# Patient Record
Sex: Male | Born: 1975 | Hispanic: Yes | Marital: Single | Smoking: Current every day smoker
Health system: Southern US, Community
[De-identification: ages and names within clinical notes are randomized; demographics above are authoritative.]

## PROBLEM LIST (undated history)

## (undated) DIAGNOSIS — I10 Essential (primary) hypertension: Secondary | ICD-10-CM

## (undated) HISTORY — PX: OTHER SURGICAL HISTORY: SHX169

---

## 2013-11-15 ENCOUNTER — Emergency Department (HOSPITAL_COMMUNITY): Payer: Self-pay

## 2013-11-15 ENCOUNTER — Encounter (HOSPITAL_COMMUNITY): Payer: Self-pay | Admitting: Emergency Medicine

## 2013-11-15 ENCOUNTER — Emergency Department (HOSPITAL_COMMUNITY)
Admission: EM | Admit: 2013-11-15 | Discharge: 2013-11-15 | Disposition: A | Discharge status: Home / Self Care | Payer: Self-pay | Attending: Emergency Medicine | Admitting: Emergency Medicine

## 2013-11-15 DIAGNOSIS — Y9389 Activity, other specified: Secondary | ICD-10-CM | POA: Insufficient documentation

## 2013-11-15 DIAGNOSIS — IMO0002 Reserved for concepts with insufficient information to code with codable children: Secondary | ICD-10-CM | POA: Insufficient documentation

## 2013-11-15 DIAGNOSIS — F17200 Nicotine dependence, unspecified, uncomplicated: Secondary | ICD-10-CM | POA: Insufficient documentation

## 2013-11-15 DIAGNOSIS — T148XXA Other injury of unspecified body region, initial encounter: Secondary | ICD-10-CM

## 2013-11-15 DIAGNOSIS — M549 Dorsalgia, unspecified: Secondary | ICD-10-CM

## 2013-11-15 DIAGNOSIS — Y929 Unspecified place or not applicable: Secondary | ICD-10-CM | POA: Insufficient documentation

## 2013-11-15 DIAGNOSIS — X500XXA Overexertion from strenuous movement or load, initial encounter: Secondary | ICD-10-CM | POA: Insufficient documentation

## 2013-11-15 LAB — URINALYSIS, ROUTINE W REFLEX MICROSCOPIC
Bilirubin Urine: NEGATIVE
Glucose, UA: NEGATIVE mg/dL
HGB URINE DIPSTICK: NEGATIVE
Ketones, ur: NEGATIVE mg/dL
Leukocytes, UA: NEGATIVE
NITRITE: NEGATIVE
PROTEIN: NEGATIVE mg/dL
Specific Gravity, Urine: 1.025 (ref 1.005–1.030)
UROBILINOGEN UA: 0.2 mg/dL (ref 0.0–1.0)
pH: 5.5 (ref 5.0–8.0)

## 2013-11-15 MED ORDER — OXYCODONE-ACETAMINOPHEN 5-325 MG PO TABS
1.0000 | ORAL_TABLET | ORAL | Status: DC | PRN
Start: 2013-11-15 — End: 2014-01-05

## 2013-11-15 MED ORDER — IBUPROFEN 800 MG PO TABS
800.0000 mg | ORAL_TABLET | Freq: Once | ORAL | Status: AC
  Administered 2013-11-15: 800 mg via ORAL
  Filled 2013-11-15: qty 1

## 2013-11-15 MED ORDER — PREDNISONE 10 MG PO TABS: ORAL_TABLET | ORAL | Status: DC

## 2013-11-15 MED ORDER — CYCLOBENZAPRINE HCL 10 MG PO TABS: 10.0000 mg | ORAL_TABLET | Freq: Three times a day (TID) | ORAL | Status: DC | PRN

## 2013-11-15 MED ORDER — OXYCODONE-ACETAMINOPHEN 5-325 MG PO TABS
1.0000 | ORAL_TABLET | Freq: Once | ORAL | Status: AC
  Administered 2013-11-15: 1 via ORAL
  Filled 2013-11-15: qty 1

## 2013-11-15 NOTE — Discharge Instructions (Signed)
Dolor de espalda, adultos  (Back Pain, Adult)  El dolor de espalda es frecuente. Con frecuencia mejora luego de algn tiempo. La causa suele no ser un peligro para la vida. La mayora de las personas aprende a controlarlo por sus propios medios.  CUIDADOS EN EL HOGAR   Mantngase fsicamente activo. Si puede, comience a dar cortas caminatas en un suelo plano. Trate de caminar un poco ms cada da.  Nopermanezca sentado, de pie ni conduzca automviles durante ms de 30 minutos seguidos. Nose quede en la cama.  Noevite los ejercicios ni el trabajo. La actividad puede ayudar a que la espalda se cure ms rpido.  Tenga cuidado al inclinarse o al levantar un objeto. Doble las rodillas, mantenga el [place] cerca de su cuerpo y no gire.  Duerma sobre un [employer] Acustese sobre un lado y doble las rodillas. Si se [employer], coloque una almohada debajo de las rodillas.  Tome la medicacin slo como le haya indicado el mdico.  Aplique hielo sobre la zona lesionada.  Ponga el hielo en una bolsa plstica.  Colquese una toalla entre la piel y la bolsa de hielo.  Deje la bolsa de hielo durante 15 a 3 a 4 veces por da, durante los primeros 2 o 3 das. Luego puede ir alternando entre hielo y compresas calientes.  Consulte a su mdico sobre cul ejercicios o IT sales professional.  Evite sentirse ansioso o estresado. Encuentre la forma de enfrentar el estrs, como por Lexicographer ejercicios. SOLICITE AYUDA DE INMEDIATO SI:   El dolor no desaparece aunque haga reposo o tome medicamentos para Chief Technology Officer.  El dolor no desaparece en una semana.  Tiene nuevos problemas.  No se siente mejor.  El dolor se extiende a las piernas.  No puede controlar la orina o la materia fecal.  Siente que los brazos estn dbiles o pierde la sensibilidad (estn adormecidos).  Tiene Programme researcher, broadcasting/film/video (nuseas) y vmitos.  Siente dolor abdominal.  Siente que se desvanece (se  desmaya). ASEGRESE DE QUE:   Comprende estas instrucciones.  Controlar su enfermedad.  Solicitar ayuda de inmediato si no mejora o si empeora. Document Released: 03/31/2011 Document Revised: 12/08/2011 [employer] Patient Information 2014 [place], [place].  Esguince de ligamento (Ligament Sprain) Un esguince de ligamento se produce cuando se estiran las bandas de tejido que mantienen unidos a los huesos (ligamento). CUIDADOS EN EL HOGAR   Mantenga la zona afectada en reposo.  Comience a usar la articulacin cuando su mdico se lo indique.  Mantenga el rea lesionada levantada (elevada) por encima del nivel del corazn. Esto podra aliviar la hinchazn (inflamacin).  Aplique hielo sobre la zona lesionada.  Ponga el hielo en una bolsa plstica.  Colquese una toalla entre la piel y la bolsa de hielo.  Deje el hielo durante 15 a , 3 a 4veces por da.  Use una frula, un yeso o una venda elstica segn las indicaciones de su mdico.  Solo tome los medicamentos que le haya indicado su mdico.  Utilice las [employer] como le haya indicado el mdico. No cargue peso sobre la articulacin hasta que el mdico lo autorice. SOLICITE AYUDA DE INMEDIATO SI:   Tiene ms moretones, hinchazn o dolor.  La lesin es en la pierna y tiene los dedos de los pies fros, [place], con hormigueo o adormecidos.  La lesin es en el brazo y tiene los dedos de las manos fros, [place], con hormigueo o adormecidos.  Siente un dolor intenso que  no se Pepco Holdingsalivia los medicamentos.  El dolor [place]pe[place]. ASEGRESE DE QUE:   Comprende estas instrucciones.  Controlar la enfermedad.  Recibir ayuda de inmediato si no mejora o si empeora. Document Released: 07/06/2013 [facility] Patient Information 2014 [employer].

## 2013-11-15 NOTE — ED Provider Notes (Signed)
CSN: [id number]     Arrival date & time 11/15/13  1806 History   First MD Initiated Contact with Patient 11/15/13 1823     Chief Complaint  Patient presents with  . Back Pain     (Consider location/radiation/quality/duration/timing/severity/associated sxs/prior Treatment) Patient is a 38 y.o. male presenting with back pain. The history is provided by the patient.  Back Pain Location:  Lumbar spine Quality:  Aching Radiates to:  L posterior upper leg and R posterior upper leg Pain severity:  Moderate Pain is:  Same all the time Onset quality:  Gradual Duration:  4 weeks Timing:  Constant Progression:  Unchanged Chronicity:  New Context: lifting heavy objects and twisting   Context comment:  Low back pain for 4 weeks that began after cutting wood.  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   Relieved by:  Being still Worsened by:  Ambulation, bending and sitting Ineffective treatments:  Ibuprofen Associated symptoms: no abdominal pain, no abdominal swelling, no bladder incontinence, no bowel incontinence, no chest pain, no dysuria, no fever, no headaches, no leg pain, no numbness, no paresthesias, no pelvic pain, no perianal numbness, no tingling and no weakness    Patient states that he was seen at another facility for this at the time of onset, but states that the symptoms have not improved.  He also states that his back pain is worse after working.  He also describes a cold, tingling sensation to his bilateral feet that "comes and goes".  He denies abdominal pain, fever, chills, dysuria, incontinence of bladder or bowel, or pain to his testicles   History reviewed. No pertinent past medical history. History reviewed. No pertinent past surgical history. Family History  Problem Relation Age of Onset  . Diabetes Other    History  Substance Use Topics  . Smoking status: Current Every Day Smoker -- 0.03 packs/day for 15 years    Types: Cigarettes  . Smokeless  tobacco: Never Used  . Alcohol Use: No     Comment: occasional    Review of Systems  Constitutional: Negative for fever.  Respiratory: Negative for shortness of breath.   Cardiovascular: Negative for chest pain.  Gastrointestinal: Negative for vomiting, abdominal pain, constipation and bowel incontinence.  Genitourinary: Negative for bladder incontinence, dysuria, hematuria, flank pain, decreased urine volume, difficulty urinating and pelvic pain.       No perineal numbness or incontinence of urine or feces  Musculoskeletal: Positive for back pain. Negative for joint swelling, neck pain and neck stiffness.  Skin: Negative for rash.  Neurological: Negative for tingling, weakness, numbness, headaches and paresthesias.  All other systems reviewed and are negative.      Allergies  Review of patient's allergies indicates no known allergies.  Home Medications   Current Outpatient Rx  Name  Route  Sig  Dispense  Refill  . ibuprofen (ADVIL,MOTRIN) 200 MG tablet   Oral   Take 600 mg by mouth every 6 (six) hours as needed for moderate pain.         . cyclobenzaprine (FLEXERIL) 10 MG tablet   Oral   Take 1 tablet (10 mg total) by mouth 3 (three) times daily as needed.   21 tablet   0   . oxyCODONE-acetaminophen (PERCOCET/ROXICET) 5-325 MG per tablet   Oral   Take 1 tablet by mouth every 4 (four) hours as needed for severe pain.   20 tablet   0   . predniSONE (DELTASONE) 10 MG tablet  Take 6 tablets day one, 5 tablets day two, 4 tablets day three, 3 tablets day four, 2 tablets day five, then 1 tablet day six   21 tablet   0    BP 143/95  Pulse 96  Temp(Src) 98.1 F (36.7 C) (Oral)  Resp 20  SpO2 100%   Physical Exam  Nursing note and vitals reviewed. Constitutional: He is oriented to person, place, and time. He appears well-developed and well-nourished. No distress.  HENT:  Head: Normocephalic and atraumatic.  Neck: Normal range of motion. Neck supple.    Cardiovascular: Normal rate, regular rhythm, normal heart sounds and intact distal pulses.   No murmur heard. Pulmonary/Chest: Effort normal and breath sounds normal. No respiratory distress. He exhibits no tenderness.  Abdominal: Soft. He exhibits no distension. There is no tenderness. There is no rebound and no guarding.  Musculoskeletal: Normal range of motion. He exhibits tenderness. He exhibits no edema.       Lumbar back: He exhibits tenderness and pain. He exhibits normal range of motion, no swelling, no deformity, no laceration and normal pulse.       Back:  ttp of the lumbar spine and paraspinal muscles.     DP pulses are brisk and symmetrical.  Distal sensation intact.  Hip Flexors/Extensors are intact  Neurological: He is alert and oriented to person, place, and time. He has normal strength. No sensory deficit. He exhibits normal muscle tone. Coordination and gait normal.  Reflex Scores:      Patellar reflexes are 2+ on the right side and 2+ on the left side.      Achilles reflexes are 2+ on the right side and 2+ on the left side. Skin: Skin is warm and dry. No rash noted.  Skin of the bilateral LE's is warm and pink.  CR < 2 sec    ED Course  Procedures (including critical care time) Labs Review Labs Reviewed  URINALYSIS, ROUTINE W REFLEX MICROSCOPIC   Imaging Review Dg Lumbar Spine Complete  11/15/2013   CLINICAL DATA:  Low back pain after injury.  EXAM: LUMBAR SPINE - COMPLETE 4+ VIEW  COMPARISON:  None.  FINDINGS: No acute fracture or spondylolisthesis is noted. 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 Disc spaces are well-maintained.  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 No acute abnormality seen in the lumbar spine.   Electronically Signed   By: Roque LiasJames  Green M.D.   On: 11/15/2013 19:34    EKG Interpretation   None       MDM   Final diagnoses:  Back pain  Muscle strain    Patient is well appearing, non-toxic.  pain lower back is reproduced with  palpation and SLR bilaterally.  No focal neuro deficits.  X ray and U/A results reviewed and discussed with patient.  No concerning symptoms for emergent neurological or infectious process. Pt agrees to care plan and verbalized understanding.    The patient appears reasonably screened and/or stabilized for discharge and I doubt any other medical condition or other [facility] requiring further screening, evaluation, or treatment in the ED at this time prior to discharge.     Alicea Wente L. Trisha Mangleriplett, PA-C 11/15/13 2355

## 2013-11-15 NOTE — ED Notes (Signed)
Per patient splitting wood 3-4 weeks ago. Per patient woke the next day and stated that he could hardly move. Patient c/o lower back pain that radiates into legs bilaterally. Per patient feet are becoming cold.  [facility] and given ibuprofen 800mg , cyclobenzaprine 10mg , and vicodin 5/325mg . Patient states "It would help the pain but come right back."

## 2013-11-15 NOTE — ED Notes (Signed)
Low back pain,, onset after splitting wood 4 weeks ago  .  [facility]. And given medication. But the pain has returned.  Discomfort into bil legs. And says his leg feel cold.

## 2013-11-17 NOTE — ED Provider Notes (Signed)
Medical screening examination/treatment/procedure(s) were performed by non-physician practitioner and as supervising physician I was immediately available for consultation/collaboration.  EKG Interpretation   None         Kasra Melvin W Bryar Rennie, MD 11/17/13 0100 

## 2014-01-05 ENCOUNTER — Emergency Department (HOSPITAL_COMMUNITY)
Admission: EM | Admit: 2014-01-05 | Discharge: 2014-01-05 | Disposition: A | Discharge status: Home / Self Care | Payer: Self-pay | Attending: Emergency Medicine | Admitting: Emergency Medicine

## 2014-01-05 ENCOUNTER — Encounter (HOSPITAL_COMMUNITY): Payer: Self-pay | Admitting: Emergency Medicine

## 2014-01-05 ENCOUNTER — Emergency Department (HOSPITAL_COMMUNITY): Payer: Self-pay

## 2014-01-05 DIAGNOSIS — M25569 Pain in unspecified knee: Secondary | ICD-10-CM | POA: Insufficient documentation

## 2014-01-05 DIAGNOSIS — R1011 Right upper quadrant pain: Secondary | ICD-10-CM | POA: Insufficient documentation

## 2014-01-05 DIAGNOSIS — Z791 Long term (current) use of non-steroidal anti-inflammatories (NSAID): Secondary | ICD-10-CM | POA: Insufficient documentation

## 2014-01-05 DIAGNOSIS — Z79899 Other long term (current) drug therapy: Secondary | ICD-10-CM | POA: Insufficient documentation

## 2014-01-05 DIAGNOSIS — R1012 Left upper quadrant pain: Secondary | ICD-10-CM | POA: Insufficient documentation

## 2014-01-05 DIAGNOSIS — M2550 Pain in unspecified joint: Secondary | ICD-10-CM

## 2014-01-05 DIAGNOSIS — R109 Unspecified abdominal pain: Secondary | ICD-10-CM

## 2014-01-05 DIAGNOSIS — R110 Nausea: Secondary | ICD-10-CM | POA: Insufficient documentation

## 2014-01-05 DIAGNOSIS — M549 Dorsalgia, unspecified: Secondary | ICD-10-CM | POA: Insufficient documentation

## 2014-01-05 DIAGNOSIS — M542 Cervicalgia: Secondary | ICD-10-CM | POA: Insufficient documentation

## 2014-01-05 DIAGNOSIS — Z87891 Personal history of nicotine dependence: Secondary | ICD-10-CM | POA: Insufficient documentation

## 2014-01-05 DIAGNOSIS — R079 Chest pain, unspecified: Secondary | ICD-10-CM | POA: Insufficient documentation

## 2014-01-05 DIAGNOSIS — R509 Fever, unspecified: Secondary | ICD-10-CM | POA: Insufficient documentation

## 2014-01-05 LAB — CBC WITH DIFFERENTIAL/PLATELET
BASOS ABS: 0 10*3/uL (ref 0.0–0.1)
Basophils Relative: 0 % (ref 0–1)
EOS PCT: 3 % (ref 0–5)
Eosinophils Absolute: 0.3 10*3/uL (ref 0.0–0.7)
HCT: 40.8 % (ref 39.0–52.0)
Hemoglobin: 13.3 g/dL (ref 13.0–17.0)
LYMPHS ABS: 2.6 10*3/uL (ref 0.7–4.0)
LYMPHS PCT: 23 % (ref 12–46)
MCH: 28.4 pg (ref 26.0–34.0)
MCHC: 32.6 g/dL (ref 30.0–36.0)
MCV: 87 fL (ref 78.0–100.0)
Monocytes Absolute: 0.8 10*3/uL (ref 0.1–1.0)
Monocytes Relative: 7 % (ref 3–12)
NEUTROS PCT: 67 % (ref 43–77)
Neutro Abs: 7.4 10*3/uL (ref 1.7–7.7)
PLATELETS: 398 10*3/uL (ref 150–400)
RBC: 4.69 MIL/uL (ref 4.22–5.81)
RDW: 13.6 % (ref 11.5–15.5)
WBC: 11 10*3/uL — AB (ref 4.0–10.5)

## 2014-01-05 LAB — URINALYSIS, ROUTINE W REFLEX MICROSCOPIC
BILIRUBIN URINE: NEGATIVE
GLUCOSE, UA: NEGATIVE mg/dL
Ketones, ur: NEGATIVE mg/dL
Leukocytes, UA: NEGATIVE
Nitrite: NEGATIVE
PH: 6 (ref 5.0–8.0)
Protein, ur: NEGATIVE mg/dL
Specific Gravity, Urine: 1.01 (ref 1.005–1.030)
Urobilinogen, UA: 0.2 mg/dL (ref 0.0–1.0)

## 2014-01-05 LAB — COMPREHENSIVE METABOLIC PANEL
ALK PHOS: 265 U/L — AB (ref 39–117)
ALT: 72 U/L — ABNORMAL HIGH (ref 0–53)
AST: 35 U/L (ref 0–37)
Albumin: 3.7 g/dL (ref 3.5–5.2)
BUN: 13 mg/dL (ref 6–23)
CALCIUM: 10.3 mg/dL (ref 8.4–10.5)
CO2: 29 meq/L (ref 19–32)
Chloride: 96 mEq/L (ref 96–112)
Creatinine, Ser: 0.72 mg/dL (ref 0.50–1.35)
GFR calc Af Amer: >90 mL/min (ref >90)
GFR calc non Af Amer: >90 mL/min (ref >90)
Glucose, Bld: 99 mg/dL (ref 70–99)
POTASSIUM: 4.1 meq/L (ref 3.7–5.3)
SODIUM: 139 meq/L (ref 137–147)
TOTAL PROTEIN: 9.3 g/dL — AB (ref 6.0–8.3)
Total Bilirubin: 0.7 mg/dL (ref 0.3–1.2)

## 2014-01-05 LAB — URINE MICROSCOPIC-ADD ON

## 2014-01-05 LAB — LIPASE, BLOOD: Lipase: 15 U/L (ref 11–59)

## 2014-01-05 MED ORDER — HYDROCODONE-ACETAMINOPHEN 5-325 MG PO TABS: 1.0000 | ORAL_TABLET | ORAL | Status: DC | PRN

## 2014-01-05 MED ORDER — ONDANSETRON HCL 4 MG/2ML IJ SOLN
4.0000 mg | Freq: Once | INTRAMUSCULAR | Status: AC
  Administered 2014-01-05: 4 mg via INTRAMUSCULAR
  Filled 2014-01-05: qty 2

## 2014-01-05 MED ORDER — SODIUM CHLORIDE 0.9 % IV BOLUS (SEPSIS)
1000.0000 mL | Freq: Once | INTRAVENOUS | Status: AC
  Administered 2014-01-05: 1000 mL via INTRAVENOUS

## 2014-01-05 MED ORDER — IOHEXOL 300 MG/ML  SOLN
100.0000 mL | Freq: Once | INTRAMUSCULAR | Status: AC | PRN
  Administered 2014-01-05: 100 mL via INTRAVENOUS

## 2014-01-05 MED ORDER — MORPHINE SULFATE 4 MG/ML IJ SOLN
4.0000 mg | Freq: Once | INTRAMUSCULAR | Status: AC
  Administered 2014-01-05: 4 mg via INTRAVENOUS
  Filled 2014-01-05: qty 1

## 2014-01-05 MED ORDER — CYCLOBENZAPRINE HCL 5 MG PO TABS: 5.0000 mg | ORAL_TABLET | Freq: Three times a day (TID) | ORAL | Status: DC | PRN

## 2014-01-05 MED ORDER — IBUPROFEN 600 MG PO TABS: 600.0000 mg | ORAL_TABLET | Freq: Four times a day (QID) | ORAL | Status: DC | PRN

## 2014-01-05 MED ORDER — IOHEXOL 300 MG/ML  SOLN
50.0000 mL | Freq: Once | INTRAMUSCULAR | Status: AC | PRN
  Administered 2014-01-05: 50 mL via ORAL

## 2014-01-05 NOTE — ED Notes (Signed)
Pt reports for the past 2 1/2 months has had pain in head and back and chest has been tender to touch.  Denies injury.

## 2014-01-05 NOTE — ED Provider Notes (Signed)
Medical screening examination/treatment/procedure(s) were performed by non-physician practitioner and as supervising physician I was immediately available for consultation/collaboration.   EKG Interpretation   Date/Time:  Thursday January 05 2014 10:46:26 EDT Ventricular Rate:  73 PR Interval:  166 QRS Duration: 90 QT Interval:  352 QTC Calculation: 387 R Axis:   61 Text Interpretation:  Normal sinus rhythm Normal ECG No previous ECGs  available Confirmed by KNAPP  MD-I, IVA ([id number]) on 01/05/2014 10:50:38 AM        Joya Gaskinsonald W Abdulmalik Darco, MD 01/05/14 1757

## 2014-01-05 NOTE — Care Management Note (Signed)
ED/CM noted patient did not have health insurance and/or PCP listed in the computer.  [facility] handout with information on the clinics, food pantries, and the handout for new health insurance sign-up.  Patient expressed appreciation for information received. He was also given a Rx assistance card. Pt did not speak English well, but understood about about information to assist with doctors and medications. He had a family member with him who understood some English also.

## 2014-01-05 NOTE — ED Provider Notes (Signed)
CSN: [id number]     Arrival date & time 01/05/14  1016 History   First MD Initiated Contact with Patient 01/05/14 1104     Chief Complaint  Patient presents with  . Back Pain  . Leg Pain     (Consider location/radiation/quality/duration/timing/severity/associated sxs/prior Treatment) HPI Comments: Rodney Bryant is a 38 y.o. Male presenting with worsening pain for the past 2 months which started in his lower back despite any known injury.  He was seen here for this problem at which time he was prescribed pain medication which did not relieve his symptoms.  Since that time his pain has progressed to include all over body aches, joint pain, particularly his knees, generalized headache, upper abdominal pain and mid chest pain, mid back and neck pain which is described as soreness with palpation.  He has had subjective fevers although denies chills and has had no shortness of breath,cough or vomiting although states he gets nausea and at times when his pain is severe.  He has had no rashes, no recent tick exposures.  He was seen by a doctor in [place] at which time he had blood work drawn last month which he presents with him today and was significant for an elevated white blood cell count,  elevated alk phosphatase and high platelet count.  He is scheduled to establish a primary doctor here in 2 weeks at the [facility].  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   He does have a history of heavy EtOH use, states he quit 2 months ago.   He was prescribed flexeril and hydrocodone which was helping his symptoms, but has run out of these medicines.  Of note,  Lab results dated 11/30/13 from [place] reveals an alk phos of 478, platelet count 558, wbc 13.9,  Alt 96, normal ast normal sodium 138.   Patient is a 38 y.o. male presenting with leg pain. The history is provided by the patient. The history is limited  by a language barrier. A language interpreter was used.  Leg Pain Associated symptoms: fever   Associated symptoms: no neck pain     History reviewed. No pertinent past medical history. History reviewed. No pertinent past surgical history. Family History  Problem Relation Age of Onset  . Diabetes Other    History  Substance Use Topics  . Smoking status: Former Smoker -- 0.03 packs/day for 15 years    Types: Cigarettes  . Smokeless tobacco: Never Used  . Alcohol Use: No     Comment: quit 3 mos ago    Review of Systems  Constitutional: Positive for fever.  HENT: Negative for congestion and sore throat.   Eyes: Negative.   Respiratory: Negative for chest tightness and shortness of breath.   Cardiovascular: Negative for chest pain.  Gastrointestinal: Negative for nausea and abdominal pain.  Genitourinary: Negative.   Musculoskeletal: Positive for arthralgias, joint swelling and myalgias. Negative for neck pain.  Skin: Negative.  Negative for rash and wound.  Neurological: Negative for dizziness, weakness, light-headedness, numbness and headaches.  Psychiatric/Behavioral: Negative.       Allergies  Review of patient's allergies indicates no known allergies.  Home Medications   Current Outpatient Rx  Name  Route  Sig  Dispense  Refill  . acetaminophen (TYLENOL) 500 MG tablet   Oral   Take 1,000 mg by mouth daily as needed for moderate  pain.         . ibuprofen (ADVIL,MOTRIN) 800 MG tablet   Oral   Take 800 mg by mouth daily as needed for moderate pain.         Marland Kitchen LORazepam (ATIVAN) 1 MG tablet   Oral   Take 1 mg by mouth every 8 (eight) hours.         . naproxen sodium (ANAPROX) 220 MG tablet   Oral   Take 220 mg by mouth 2 (two) times daily with a meal.         . Pediatric Multiple Vit-Vit C (VITAMIN DAILY PO)   Oral   Take 1 tablet by mouth daily.         . cyclobenzaprine (FLEXERIL) 5 MG tablet   Oral   Take 1 tablet (5 mg total) by mouth 3  (three) times daily as needed for muscle spasms.   30 tablet   0   . HYDROcodone-acetaminophen (NORCO/VICODIN) 5-325 MG per tablet   Oral   Take 1 tablet by mouth every 4 (four) hours as needed.   20 tablet   0   . ibuprofen (ADVIL,MOTRIN) 600 MG tablet   Oral   Take 1 tablet (600 mg total) by mouth every 6 (six) hours as needed.   30 tablet   0    BP 140/73  Pulse 70  Temp(Src) 98.8 F (37.1 C) (Oral)  Resp 16  Ht 5' 6"  (1.676 m)  Wt 182 lb 2 oz (82.611 kg)  BMI 29.41 kg/m2  SpO2 100% Physical Exam  Nursing note and vitals reviewed. Constitutional: He appears well-developed and well-nourished.  Appears uncomfortable  HENT:  Head: Normocephalic and atraumatic.  Eyes: Conjunctivae are normal. No scleral icterus.  Neck: Normal range of motion.  Cardiovascular: Normal rate, regular rhythm, normal heart sounds and intact distal pulses.   Pulmonary/Chest: Effort normal and breath sounds normal. He has no wheezes. He exhibits tenderness.  Tender to palpation upper chest wall  Abdominal: Soft. Bowel sounds are normal. There is no hepatosplenomegaly. There is tenderness in the right upper quadrant and left upper quadrant. There is guarding. There is no rebound, no CVA tenderness and negative Murphy's sign.  Musculoskeletal: Normal range of motion.       Left knee: Tenderness found. Medial joint line and lateral joint line tenderness noted.       Cervical back: He exhibits tenderness and pain. He exhibits no bony tenderness.  ttp across bilateral upper shoulder and paracervical musculature.  No nuchal rigidity.  Neurological: He is alert.  Skin: Skin is warm and dry. He is not diaphoretic.  Psychiatric: He has a normal mood and affect.    ED Course  Procedures (including critical care time) Labs Review Labs Reviewed  CBC WITH DIFFERENTIAL - Abnormal; Notable for the following:    WBC 11.0 (*)    All other components within normal limits  COMPREHENSIVE METABOLIC PANEL -  Abnormal; Notable for the following:    Total Protein 9.3 (*)    ALT 72 (*)    Alkaline Phosphatase 265 (*)    All other components within normal limits  URINALYSIS, ROUTINE W REFLEX MICROSCOPIC - Abnormal; Notable for the following:    Hgb urine dipstick TRACE (*)    All other components within normal limits  URINE MICROSCOPIC-ADD ON - Abnormal; Notable for the following:    Squamous Epithelial / LPF FEW (*)    Bacteria, UA FEW (*)    All other components  within normal limits  LIPASE, BLOOD   Imaging Review US Abdomen Complete  01/05/2014   CLINICAL DATA:  Back and leg pain, abdominal pain  EXAM: ULTRASOUND ABDOMEN COMPLETE  COMPARISON:  CT abdomen and pelvis 01/05/2014  FINDINGS: Gallbladder:  Mildly distended. No gallstones identified. No definite gallbladder wall thickening, pericholecystic fluid or sonographic Murphy sign.  Common bile duct:  Diameter: Normal caliber 3 mm diameter  Liver:  Normal appearance.  Hepatopetal portal venous flow.  IVC:  Normal appearance  Pancreas:  Poorly visualized due to body habitus and primarily bowel gas. Mildly heterogeneous echogenicity is seen but no definite mass or calcification is identified on preceding CT.  Spleen:  Normal appearance, 9.0 cm length  Right Kidney:  Length: 11.8 cm.  Normal morphology without mass or hydronephrosis.  Left Kidney:  Length: 14.2 cm.  Normal morphology without mass or hydronephrosis.  Abdominal aorta:  Normal caliber  Other findings:  No free-fluid  IMPRESSION: No definite acute upper abdominal abnormalities identified with limitations secondary to primarily bowel gas.   Electronically Signed   By: Lavonia Dana M.D.   On: 01/05/2014 16:57   Ct Abdomen Pelvis W Contrast  01/05/2014   CLINICAL DATA:  Abdominal pain  EXAM: CT ABDOMEN AND PELVIS WITH CONTRAST  TECHNIQUE: Multidetector CT imaging of the abdomen and pelvis was performed using the standard protocol following bolus administration of intravenous contrast.  CONTRAST:   The patient is C1 under cc of Omnipaque 300 intravenously and also received oral contrast material. .  COMPARISON:  None.  FINDINGS: Patient motion limits the study in the upper portions of the abdomen. The liver exhibits no focal mass nor ductal dilation. The gallbladder is mildly distended. No stones are evident. The gallbladder wall is not distinct. The pancreas, spleen, moderately distended stomach, adrenal glands, and kidneys are normal in appearance. There are likely parapelvic cysts versus an extrarenal pelvis on the left. There is no hydronephrosis nor hydroureter. The caliber of the abdominal aorta is normal. There is no periaortic or pericaval lymphadenopathy.  The partially contrast filled loops of small bowel exhibit no evidence of ileus nor of obstruction. Contrast has not yet reached the colon. The is stool and gas pattern within the colon is normal. There is a normal appearing appendix demonstrated. There are scattered diverticula without objective evidence of acute diverticulitis. The rectosigmoid colon is normal in appearance. The urinary bladder and prostate gland and seminal vesicles exhibit no acute abnormalities. There are is no inguinal nor umbilical hernia. There is no evidence of ascites.  The lumbar vertebral bodies are preserved in height. 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 L5 is transitional  IMPRESSION: 1. The gallbladder is mildly distended. No stones are demonstrated but the wall may be minimally thickened. Gallbladder ultrasound is recommended. 2. No acute bowel abnormality is demonstrated. There is no evidence of acute urinary tract abnormality. 3. 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   Electronically Signed   By: David  Martinique   On: 01/05/2014 14:20     EKG Interpretation   Date/Time:  Thursday January 05 2014 10:46:26 EDT Ventricular Rate:  73 PR Interval:  166 QRS Duration: 90 QT Interval:   352 QTC Calculation: 387 R Axis:   61 Text Interpretation:  Normal sinus rhythm Normal ECG No previous ECGs  available Confirmed by KNAPP  MD-I, IVA ([id number]) on 01/05/2014 10:50:38 AM     Results for orders placed during the hospital  encounter of 01/05/14  CBC WITH DIFFERENTIAL      Result Value Ref Range   WBC 11.0 (*) 4.0 - 10.5 K/uL   RBC 4.69  4.22 - 5.81 MIL/uL   Hemoglobin 13.3  13.0 - 17.0 g/dL   HCT 40.8  39.0 - 52.0 %   MCV 87.0  78.0 - 100.0 fL   MCH 28.4  26.0 - 34.0 pg   MCHC 32.6  30.0 - 36.0 g/dL   RDW 13.6  11.5 - 15.5 %   Platelets 398  150 - 400 K/uL   Neutrophils Relative % 67  43 - 77 %   Neutro Abs 7.4  1.7 - 7.7 K/uL   Lymphocytes Relative 23  12 - 46 %   Lymphs Abs 2.6  0.7 - 4.0 K/uL   Monocytes Relative 7  3 - 12 %   Monocytes Absolute 0.8  0.1 - 1.0 K/uL   Eosinophils Relative 3  0 - 5 %   Eosinophils Absolute 0.3  0.0 - 0.7 K/uL   Basophils Relative 0  0 - 1 %   Basophils Absolute 0.0  0.0 - 0.1 K/uL  COMPREHENSIVE METABOLIC PANEL      Result Value Ref Range   Sodium 139  137 - 147 mEq/L   Potassium 4.1  3.7 - 5.3 mEq/L   Chloride 96  96 - 112 mEq/L   CO2 29  19 - 32 mEq/L   Glucose, Bld 99  70 - 99 mg/dL   BUN 13  6 - 23 mg/dL   Creatinine, Ser 0.72  0.50 - 1.35 mg/dL   Calcium 10.3  8.4 - 10.5 mg/dL   Total Protein 9.3 (*) 6.0 - 8.3 g/dL   Albumin 3.7  3.5 - 5.2 g/dL   AST 35  0 - 37 U/L   ALT 72 (*) 0 - 53 U/L   Alkaline Phosphatase 265 (*) 39 - 117 U/L   Total Bilirubin 0.7  0.3 - 1.2 mg/dL   GFR calc non Af Amer >90  >90 mL/min   GFR calc Af Amer >90  >90 mL/min  LIPASE, BLOOD      Result Value Ref Range   Lipase 15  11 - 59 U/L  URINALYSIS, ROUTINE W REFLEX MICROSCOPIC      Result Value Ref Range   Color, Urine YELLOW  YELLOW   APPearance CLEAR  CLEAR   Specific Gravity, Urine 1.010  1.005 - 1.030   pH 6.0  5.0 - 8.0   Glucose, UA NEGATIVE  NEGATIVE mg/dL   Hgb urine dipstick TRACE (*) NEGATIVE   Bilirubin Urine NEGATIVE  NEGATIVE    Ketones, ur NEGATIVE  NEGATIVE mg/dL   Protein, ur NEGATIVE  NEGATIVE mg/dL   Urobilinogen, UA 0.2  0.0 - 1.0 mg/dL   Nitrite NEGATIVE  NEGATIVE   Leukocytes, UA NEGATIVE  NEGATIVE  URINE MICROSCOPIC-ADD ON      Result Value Ref Range   Squamous Epithelial / LPF FEW (*) RARE   WBC, UA 0-2  <3 WBC/hpf   RBC / HPF 3-6  <3 RBC/hpf   Bacteria, UA FEW (*) RARE     MDM   Final diagnoses:  Abdominal pain  Joint pain    Multiple complaints with most worrisome exam finding being upper abdominal pain with guarding.  Labs and Ct/US reviewed, no acute findings,  Slightly elevated alt, still elevated alk phos.  Pt does have f/u -advised to keep this appt.  In the interim,  He was prescribed flexeril, ibuprofen, hydrocodone.    The patient appears reasonably screened and/or stabilized for discharge and I doubt any other medical condition or other [facility] requiring further screening, evaluation, or treatment in the ED at this time prior to discharge.     Evalee Jefferson, PA-C 01/05/14 1733

## 2014-01-05 NOTE — Discharge Instructions (Signed)
Dolor abdominal en adultos (Abdominal Pain, Adult) El dolor puede tener muchas causas. Normalmente la causa del dolor abdominal no es una enfermedad y Scientist, clinical (histocompatibility and immunogenetics) sin TEFL teacher. Frecuentemente puede controlarse y tratarse en casa. Su mdico le Medical sales representative examen fsico y posiblemente solicite anlisis de sangre y radiografas para ayudar a Chief Strategy Officer la gravedad de su dolor. Sin embargo, en [employer], debe transcurrir ms tiempo antes de que se pueda Clinical research associate una causa evidente del dolor. Antes de llegar a ese punto, es posible que su mdico no sepa si necesita ms pruebas o un tratamiento ms profundo. INSTRUCCIONES PARA EL CUIDADO EN EL HOGAR  Est atento al dolor para ver si hay cambios. Las siguientes indicaciones ayudarn a Architectural technologist que pueda sentir:  [place] solo medicamentos de venta libre o recetados, segn las indicaciones del mdico.  No tome laxantes a menos que se lo haya indicado su mdico.  Pruebe con Neomia Dear dieta lquida absoluta (caldo, t o agua) segn se lo indique su mdico. Introduzca gradualmente una dieta normal, segn su tolerancia. SOLICITE ATENCIN MDICA SI:  Tiene dolor abdominal sin explicacin.  Tiene dolor abdominal relacionado con nuseas o diarrea.  Tiene dolor cuando orina o defeca.  Experimenta dolor abdominal que lo despierta de noche.  Tiene dolor abdominal que empeora o mejora cuando come alimentos.  Tiene dolor abdominal que empeora cuando come alimentos grasosos. SOLICITE ATENCIN MDICA DE INMEDIATO SI:   El dolor no desaparece en un plazo mximo de 2horas.  Tiene fiebre.  No deja de (vomitar).  El Engineer, mining se siente solo en partes del abdomen, como el lado derecho o la parte inferior izquierda del abdomen.  Evaca materia fecal sanguinolenta o negra, de aspecto alquitranado. ASEGRESE DE QUE:  Comprende estas instrucciones.  Controlar su afeccin.  Recibir ayuda de inmediato si no mejora o si empeora. Document  Released: 09/15/2005 Document Revised: 07/06/2013 [facility] Patient Information 2014 [place], [place].  Artralgia (Arthralgia) El profesional que le asiste ha diagnosticado que usted padece de artralgia. Artralgia significa simplemente dolor en una articulacin. Las causas pueden ser muchas; entre ellas se incluyen:  Una lesin en la articulacin que provoca inflamacin (molestias).  Desgaste de las articulaciones que se produce con el avance de los aos (osteoartritis).  Uso excesivo de Nurse, learning disability.  Diversas formas de artritis.  Infecciones en la articulacin. Cualquiera que sea la causa del dolor, generalmente hay buena respuesta a los antiinflamatorios y al reposo. La excepcin ocurre cuando la articulacin est infectada y [address], si la causa es una infeccin bacteriana (por una bacteria), se tratan con antibiticos (medicamentos que [employer] grmenes). INSTRUCCIONES PARA EL CUIDADO DOMICILIARIO  Mantenga en reposo la zona lesionada durante el tiempo que se lo indique su mdico, o el tiempo que le indique el profesional que lo asiste. Luego comience a Chemical engineer esa articulacin lentamente del modo en que se lo aconseje el profesional y a Dentist se lo permita. El uso de [employer] puede ser beneficioso en el caso de lesiones en el tobillo, rodilla o cadera. Si la rodilla est entablillada o enyesada, muvala y cudela como se le indic. Si hoy le han colocado un venda elstica o que se estira), debe retirarlo y colocarlo nuevamente cada 3  4 horas. No debe aplicarlo de manera muy apretada, pero s con la firmeza necesaria para evitar la hinchazn. Vigile los dedos de las manos y los pies y observe si se hinchan, se tornan azules, se enfran o entumecen o siente dolor intenso.  Si se presenta alguno de estos sntomas (problemas), retire el vendaje y aplquelo nuevamente de un modo ms flojo. Si estos sntomas persisten, contacte al profesional que lo asiste o acuda  nuevamente a Educational psychologisteste centro.  Durante las primeras 24 horas, mientras est Minnetristaacostado, mantenga elevada la extremidad Devon Energylesionada sobre 2 almohadas.  Mientras se encuentra despierto Systems analystdurante el primer medio da aplique hielo durante 15 a 20 minutos, cada dos horas, para Engineer, materialsaliviar el dolor; luego 3 a 4 veces por Environmental consultantda durante las primeras 48 horas. Ponga el hielo en una bolsa plstica y coloque una toalla entre la bolsa y la piel.  Use la tablilla, el yeso, el vendaje elstico o el cabestrillo segn se le ha indicado.  Utilice los medicamentos de venta libre o de prescripcin para Chief Technology Officerel dolor, Environmental health practitionerel malestar o la [place]hamfiebre, segn se lo indique el profesional que lo asiste. No tomeaspirina inmediatamente despus de la lesin a menos que se lo haya indicado su mdico. La aspirina puede ocasionarle un aumento en el sangrado y moretones en los tejidos.  Si se le aconsej la utilizacin de Anokamuletas, contine usndolas segn las instrucciones y no vuelva a soportar peso sobre la articulacin lesionada hasta que se le indique que puede [place] Cityhacerlo. Un dolor persistente y la incapacidad para Chemical engineerutilizar el rea afectada durante 2  3 das son seales de Control and instrumentation engineeralerta que le indican que debe consultar a un profesional para Education officer, environmentalrealizar un seguimiento cuanto antes. Al comienzo, una fractura (ruptura del hueso) lineal puede no ser visible en las radiografas. Un dolor e hinchazn persistentes indican que necesita una evaluacin ms profunda, que no debe utilizar la articulacin lesionada o soportar peso (use las muletas si se le ha indicado) y/o que debe realizarse radiografas complementarias. En muchos casos las radiografas no revelan las fracturas pequeas hasta una semana o diez das ms tarde. Concierte una cita para Medical sales representativerealizar un seguimiento con el profesional que lo asiste o con aqul al que lo hayan remitido. Es posible que un radilogo (especialista en la lectura de radiografas) revise nuevamente sus placas. Asegrese de Financial risk analystaveriguar cmo retirar  los resultados de sus radiografas. No piense que el resultado es normal por el hecho de que no lo hayamos llamado. SOLICITE ATENCIN MDICA SI: Aumentan los moretones, la hinchazn o Chief Technology Officerel dolor. SOLICITE ATENCIN MDICA DE INMEDIATO SI:  Sus dedos estn adormecidos o presentan una Secondary school teachercoloracin azul.  El dolor no responde a los medicamentos y sigue igual o Insurance underwriterempeora.  El dolor en la articulacin se intensifica.  La temperatura eleva por encima de 38,9 C (102 F).  Le resulta cada vez ms difcil moverla. EST SEGURO QUE:   Comprende las instrucciones para el alta mdica.  Controlar su enfermedad.  Solicitar atencin mdica de inmediato segn las indicaciones. Document Released: 09/15/2005 Document Revised: 12/08/2011 [facility] Patient Information 2014 [place], [place].   You may take the hydrocodone prescribed for pain relief.  This will make you drowsy - do not drive within 4 hours of taking this medication.

## 2014-01-05 NOTE — ED Notes (Signed)
Pt alert & oriented x4, stable gait. Patient given discharge instructions, paperwork & prescription(s). Patient  instructed to stop at the registration desk to finish any additional paperwork. Patient verbalized understanding. Pt left department w/ no further questions. 

## 2014-01-25 ENCOUNTER — Other Ambulatory Visit (HOSPITAL_COMMUNITY): Payer: Self-pay | Admitting: Physician Assistant

## 2014-01-25 ENCOUNTER — Ambulatory Visit (HOSPITAL_COMMUNITY)
Admission: RE | Admit: 2014-01-25 | Discharge: 2014-01-25 | Disposition: A | Discharge status: Home / Self Care | Payer: Self-pay | Source: Ambulatory Visit | Attending: Physician Assistant | Admitting: Physician Assistant

## 2014-01-25 DIAGNOSIS — R52 Pain, unspecified: Secondary | ICD-10-CM

## 2014-01-25 DIAGNOSIS — M542 Cervicalgia: Secondary | ICD-10-CM | POA: Insufficient documentation

## 2014-01-25 DIAGNOSIS — M25469 Effusion, unspecified knee: Secondary | ICD-10-CM | POA: Insufficient documentation

## 2014-01-25 DIAGNOSIS — M25569 Pain in unspecified knee: Secondary | ICD-10-CM | POA: Insufficient documentation

## 2014-02-14 ENCOUNTER — Emergency Department (HOSPITAL_COMMUNITY)
Admission: EM | Admit: 2014-02-14 | Discharge: 2014-02-14 | Disposition: A | Discharge status: Home / Self Care | Payer: Self-pay | Attending: Emergency Medicine | Admitting: Emergency Medicine

## 2014-02-14 ENCOUNTER — Encounter (HOSPITAL_COMMUNITY): Payer: Self-pay | Admitting: Emergency Medicine

## 2014-02-14 ENCOUNTER — Emergency Department (HOSPITAL_COMMUNITY): Payer: Self-pay

## 2014-02-14 DIAGNOSIS — Y929 Unspecified place or not applicable: Secondary | ICD-10-CM | POA: Insufficient documentation

## 2014-02-14 DIAGNOSIS — IMO0002 Reserved for concepts with insufficient information to code with codable children: Secondary | ICD-10-CM | POA: Insufficient documentation

## 2014-02-14 DIAGNOSIS — M25469 Effusion, unspecified knee: Secondary | ICD-10-CM | POA: Insufficient documentation

## 2014-02-14 DIAGNOSIS — Y939 Activity, unspecified: Secondary | ICD-10-CM | POA: Insufficient documentation

## 2014-02-14 DIAGNOSIS — M549 Dorsalgia, unspecified: Secondary | ICD-10-CM | POA: Insufficient documentation

## 2014-02-14 DIAGNOSIS — M25569 Pain in unspecified knee: Secondary | ICD-10-CM | POA: Insufficient documentation

## 2014-02-14 DIAGNOSIS — G8929 Other chronic pain: Secondary | ICD-10-CM | POA: Insufficient documentation

## 2014-02-14 DIAGNOSIS — R079 Chest pain, unspecified: Secondary | ICD-10-CM | POA: Insufficient documentation

## 2014-02-14 DIAGNOSIS — M542 Cervicalgia: Secondary | ICD-10-CM | POA: Insufficient documentation

## 2014-02-14 DIAGNOSIS — Z87891 Personal history of nicotine dependence: Secondary | ICD-10-CM | POA: Insufficient documentation

## 2014-02-14 DIAGNOSIS — S838X9A Sprain of other specified parts of unspecified knee, initial encounter: Secondary | ICD-10-CM

## 2014-02-14 DIAGNOSIS — Z791 Long term (current) use of non-steroidal anti-inflammatories (NSAID): Secondary | ICD-10-CM | POA: Insufficient documentation

## 2014-02-14 DIAGNOSIS — H318 Other specified disorders of choroid: Secondary | ICD-10-CM | POA: Insufficient documentation

## 2014-02-14 DIAGNOSIS — X58XXXA Exposure to other specified factors, initial encounter: Secondary | ICD-10-CM | POA: Insufficient documentation

## 2014-02-14 DIAGNOSIS — R51 Headache: Secondary | ICD-10-CM | POA: Insufficient documentation

## 2014-02-14 LAB — CBC WITH DIFFERENTIAL/PLATELET
BASOS PCT: 0 % (ref 0–1)
Basophils Absolute: 0 10*3/uL (ref 0.0–0.1)
EOS PCT: 8 % — AB (ref 0–5)
Eosinophils Absolute: 1 10*3/uL — ABNORMAL HIGH (ref 0.0–0.7)
HCT: 32.9 % — ABNORMAL LOW (ref 39.0–52.0)
HEMOGLOBIN: 10.8 g/dL — AB (ref 13.0–17.0)
LYMPHS ABS: 3.6 10*3/uL (ref 0.7–4.0)
Lymphocytes Relative: 27 % (ref 12–46)
MCH: 27.4 pg (ref 26.0–34.0)
MCHC: 32.8 g/dL (ref 30.0–36.0)
MCV: 83.5 fL (ref 78.0–100.0)
MONO ABS: 0.9 10*3/uL (ref 0.1–1.0)
MONOS PCT: 7 % (ref 3–12)
Neutro Abs: 7.9 10*3/uL — ABNORMAL HIGH (ref 1.7–7.7)
Neutrophils Relative %: 58 % (ref 43–77)
Platelets: 565 10*3/uL — ABNORMAL HIGH (ref 150–400)
RBC: 3.94 MIL/uL — AB (ref 4.22–5.81)
RDW: 13.1 % (ref 11.5–15.5)
WBC: 13.5 10*3/uL — ABNORMAL HIGH (ref 4.0–10.5)

## 2014-02-14 LAB — BASIC METABOLIC PANEL
BUN: 12 mg/dL (ref 6–23)
CALCIUM: 9.3 mg/dL (ref 8.4–10.5)
CO2: 27 mEq/L (ref 19–32)
CREATININE: 0.71 mg/dL (ref 0.50–1.35)
Chloride: 98 mEq/L (ref 96–112)
GLUCOSE: 90 mg/dL (ref 70–99)
POTASSIUM: 4 meq/L (ref 3.7–5.3)
Sodium: 138 mEq/L (ref 137–147)

## 2014-02-14 MED ORDER — HYDROCODONE-ACETAMINOPHEN 5-325 MG PO TABS: 1.0000 | ORAL_TABLET | Freq: Four times a day (QID) | ORAL | Status: DC | PRN

## 2014-02-14 MED ORDER — HYDROCODONE-ACETAMINOPHEN 5-325 MG PO TABS
1.0000 | ORAL_TABLET | Freq: Once | ORAL | Status: AC
  Administered 2014-02-14: 1 via ORAL
  Filled 2014-02-14: qty 1

## 2014-02-14 MED ORDER — NAPROXEN 500 MG PO TABS: 500.0000 mg | ORAL_TABLET | Freq: Two times a day (BID) | ORAL | Status: DC

## 2014-02-14 NOTE — ED Notes (Signed)
Reports pain to left knee, worsening x 3 months.  Sent here by free clinic states pt's WBCs were elevated.  Denies injury.

## 2014-02-14 NOTE — Discharge Instructions (Signed)
I shows significant injuries to the left knee to include bilateral meniscus tears in some ligament injuries. Wear the knee immobilizer at all time for comfort. Make an appointment with orthopedics. Take the Naprosyn on regular basis supplement with a hydrocodone as needed for additional pain control. This will not get better on its own. Need to followup with orthopedics. Work note provided.

## 2014-02-14 NOTE — ED Provider Notes (Signed)
CSN: [id number]     Arrival date & time 02/14/14  1006 History  This chart was scribed for Shelda Jakes, MD by Charline Bills, ED Scribe. The patient was seen in room APA10/APA10. Patient's care was started at 12:31 PM.   Chief Complaint  Patient presents with  . Leg Pain   Patient is a 38 y.o. male presenting with leg pain. The history is provided by the patient. No language interpreter was used.  Leg Pain Location:  Knee Time since incident:  3 months Associated symptoms: back pain and neck pain   Associated symptoms: no fever    HPI Comments: Rodney Bryant is a 38 y.o. male who presents to the Emergency Department complaining of L medial and lateral knee pain onset 3 months ago. Pt also reports associated neck and back pain. Pt currently rates his pain 10/10. Pt was seen in April for same symptoms. Pt's WBC was 11,000 in April and 13,000 today. [facility].  History reviewed. No pertinent past medical history. History reviewed. No pertinent past surgical history. Family History  Problem Relation Age of Onset  . Diabetes Other    History  Substance Use Topics  . Smoking status: Former Smoker -- 0.03 packs/day for 15 years    Types: Cigarettes  . Smokeless tobacco: Never Used  . Alcohol Use: No     Comment: quit 3 mos ago    Review of Systems  Constitutional: Negative for fever.  HENT: Negative for rhinorrhea and sore throat.   Eyes: Positive for visual disturbance (blurred vision).  Respiratory: Negative for cough and shortness of breath.   Cardiovascular: Positive for chest pain.  Gastrointestinal: Negative for nausea, vomiting, abdominal pain and diarrhea.  Genitourinary: Negative for dysuria.  Musculoskeletal: Positive for arthralgias, back pain, joint swelling and neck pain.  Skin: Negative for rash.  Neurological: Positive for headaches.  Hematological: Does not bruise/bleed easily.  Psychiatric/Behavioral:  Negative for confusion.  All other systems reviewed and are negative.   Allergies  Review of patient's allergies indicates no known allergies.  Home Medications   Prior to Admission medications   Medication Sig Start Date End Date Taking? Authorizing Provider  naproxen sodium (ALEVE) 220 MG tablet Take 220 mg by mouth 2 (two) times daily with a meal.   Yes Historical Provider, MD   Triage Vitals: BP 147/94  Pulse 62  Temp(Src) 98.3 F (36.8 C) (Oral)  Resp 16  Ht 5\' 5"  (1.651 m)  Wt 180 lb (81.647 kg)  BMI 29.95 kg/m2  SpO2 100% Physical Exam  Nursing note and vitals reviewed. Constitutional: He is oriented to person, place, and time. He appears well-developed and well-nourished. No distress.  HENT:  Head: Normocephalic and atraumatic.  Eyes: EOM are normal.  Neck: Neck supple. No tracheal deviation present.  Cardiovascular: Normal rate and regular rhythm.   Pulses:      Dorsalis pedis pulses are 2+ on the right side, and 2+ on the left side.  Pulmonary/Chest: Effort normal and breath sounds normal. No respiratory distress.  Abdominal: Bowel sounds are normal. There is no tenderness.  Musculoskeletal: Normal range of motion. He exhibits no edema.       Left knee: He exhibits swelling.  Increased warmth, no redness   Neurological: He is alert and oriented to person, place, and time.  Skin: Skin is warm and dry.  Psychiatric: He has a normal mood and affect. His behavior is normal.  ED Course  Procedures (including critical care time) DIAGNOSTIC STUDIES: Oxygen Saturation is 100% on RA, normal by my interpretation.    COORDINATION OF CARE: 12:36 PM-Discussed treatment plan which includes MRI with pt at bedside and pt agreed to plan.   Labs Review Labs Reviewed  CBC WITH DIFFERENTIAL - Abnormal; Notable for the following:    WBC 13.5 (*)    RBC 3.94 (*)    Hemoglobin 10.8 (*)    HCT 32.9 (*)    Platelets 565 (*)    Neutro Abs 7.9 (*)    Eosinophils Relative 8  (*)    Eosinophils Absolute 1.0 (*)    All other components within normal limits  BASIC METABOLIC PANEL   Results for orders placed during the hospital encounter of 02/14/14  CBC WITH DIFFERENTIAL      Result Value Ref Range   WBC 13.5 (*) 4.0 - 10.5 K/uL   RBC 3.94 (*) 4.22 - 5.81 MIL/uL   Hemoglobin 10.8 (*) 13.0 - 17.0 g/dL   HCT 60.432.9 (*) 54.039.0 - 98.152.0 %   MCV 83.5  78.0 - 100.0 fL   MCH 27.4  26.0 - 34.0 pg   MCHC 32.8  30.0 - 36.0 g/dL   RDW 19.113.1  47.811.5 - 29.515.5 %   Platelets 565 (*) 150 - 400 K/uL   Neutrophils Relative % 58  43 - 77 %   Neutro Abs 7.9 (*) 1.7 - 7.7 K/uL   Lymphocytes Relative 27  12 - 46 %   Lymphs Abs 3.6  0.7 - 4.0 K/uL   Monocytes Relative 7  3 - 12 %   Monocytes Absolute 0.9  0.1 - 1.0 K/uL   Eosinophils Relative 8 (*) 0 - 5 %   Eosinophils Absolute 1.0 (*) 0.0 - 0.7 K/uL   Basophils Relative 0  0 - 1 %   Basophils Absolute 0.0  0.0 - 0.1 K/uL  BASIC METABOLIC PANEL      Result Value Ref Range   Sodium 138  137 - 147 mEq/L   Potassium 4.0  3.7 - 5.3 mEq/L   Chloride 98  96 - 112 mEq/L   CO2 27  19 - 32 mEq/L   Glucose, Bld 90  70 - 99 mg/dL   BUN 12  6 - 23 mg/dL   Creatinine, Ser 6.210.71  0.50 - 1.35 mg/dL   Calcium 9.3  8.4 - 30.810.5 mg/dL   GFR calc non Af Amer >90  >90 mL/min   GFR calc Af Amer >90  >90 mL/min     Imaging Review Mr Knee Left  Wo Contrast  02/14/2014   CLINICAL DATA:  Left knee pain and swelling.  No known injury.  EXAM: MRI OF THE LEFT KNEE WITHOUT CONTRAST  TECHNIQUE: Multiplanar, multisequence MR imaging of the knee was performed. No intravenous contrast was administered.  COMPARISON:  Radiographs 02/14/2014.  FINDINGS: Examination is limited by patient motion.  MENISCI  Medial meniscus: Fairly extensive peripheral all longitudinal tear involving the posterior horn and mid body region.  Lateral meniscus:  Peripheral tear involving the anterior horn.  LIGAMENTS  Cruciates:  Intact.  Collaterals: MCL ligament sprain proximally with MCL  and pes anserine bursitis.  CARTILAGE  Patellofemoral:  Mild degenerative chondrosis.  Medial:  Mild degenerative chondrosis.  Lateral:  Mild degenerative chondrosis.  Joint:  Moderate-sized joint effusion and moderate synovitis.  Popliteal Fossa: No popliteal mass. Small Baker's cyst and moderate fluid tracking back along the popliteus tendon.  Extensor Mechanism: The patella retinacular structures are intact and the quadriceps and patellar tendons are intact. Moderate distal patellar tendinopathy.  Bones:  No acute bony findings.  IMPRESSION: 1. Medial and lateral meniscal tears. 2. MCL ligament sprain and MCL and pes anserine bursitis. 3. Moderate-sized joint effusion and moderate synovitis. 4. Small Baker's cyst and moderate fluid tracking back along the popliteus tendon.   Electronically Signed   By: Loralie ChampagneMark  Gallerani M.D.   On: 02/14/2014 13:57   Dg Knee Complete 4 Views Left  02/14/2014   CLINICAL DATA:  Three month history of worsening left knee pain; report of elevated white blood cell count  EXAM: LEFT KNEE - COMPLETE 4+ VIEW  COMPARISON:  Left knee series of January 25, 2014  FINDINGS: Four views of the left knee reveal the bones to be adequately mineralized. There is no lytic or blastic lesion or periosteal reaction. There is no significant degenerative bony change. No abnormal soft tissue calcifications are demonstrated. No definite joint effusion is demonstrated.  IMPRESSION: There is no acute bony abnormality of the left knee. Specifically there are no plain film findings to suggest osteomyelitis. If the patient's symptoms persist and remain unexplained, further evaluation with MRI may be of value.   Electronically Signed   By: David  SwazilandJordan   On: 02/14/2014 12:10     EKG Interpretation None      MDM   Final diagnoses:  Knee pain, chronic  Meniscal injury    Result MRI noted. Patient will need orthopedic followup will treat with knee immobilizer and crutches anti-inflammatories and pain  medicine. Clinically this is not consistent with a septic joint. Patient having trouble with his knee since January with constant chronic pain and swelling. No fevers. Patient improved with pain medicine and knee immobilizer here in the emergency apartment.  I personally performed the services described in this documentation, which was scribed in my presence. The recorded information has been reviewed and is accurate.    Shelda JakesScott W. Sydelle Sherfield, MD 02/14/14 [phone]

## 2014-02-24 ENCOUNTER — Encounter (HOSPITAL_COMMUNITY): Payer: Self-pay | Admitting: Pharmacy Technician

## 2014-02-24 ENCOUNTER — Encounter (HOSPITAL_COMMUNITY)
Admission: RE | Admit: 2014-02-24 | Discharge: 2014-02-24 | Disposition: A | Discharge status: Home / Self Care | Payer: Self-pay | Source: Ambulatory Visit | Attending: Orthopaedic Surgery | Admitting: Orthopaedic Surgery

## 2014-02-24 ENCOUNTER — Other Ambulatory Visit: Payer: Self-pay | Admitting: Radiology

## 2014-02-24 ENCOUNTER — Encounter (HOSPITAL_COMMUNITY): Payer: Self-pay

## 2014-02-24 DIAGNOSIS — Z01812 Encounter for preprocedural laboratory examination: Secondary | ICD-10-CM | POA: Insufficient documentation

## 2014-02-24 LAB — URINALYSIS, ROUTINE W REFLEX MICROSCOPIC
Bilirubin Urine: NEGATIVE
GLUCOSE, UA: NEGATIVE mg/dL
HGB URINE DIPSTICK: NEGATIVE
Ketones, ur: NEGATIVE mg/dL
LEUKOCYTES UA: NEGATIVE
Nitrite: NEGATIVE
PH: 7.5 (ref 5.0–8.0)
Protein, ur: NEGATIVE mg/dL
Specific Gravity, Urine: 1.01 (ref 1.005–1.030)
Urobilinogen, UA: 0.2 mg/dL (ref 0.0–1.0)

## 2014-02-24 LAB — COMPREHENSIVE METABOLIC PANEL
ALK PHOS: 257 U/L — AB (ref 39–117)
ALT: 32 U/L (ref 0–53)
AST: 21 U/L (ref 0–37)
Albumin: 3.3 g/dL — ABNORMAL LOW (ref 3.5–5.2)
BUN: 13 mg/dL (ref 6–23)
CO2: 25 mEq/L (ref 19–32)
CREATININE: 0.66 mg/dL (ref 0.50–1.35)
Calcium: 9.6 mg/dL (ref 8.4–10.5)
Chloride: 97 mEq/L (ref 96–112)
GFR calc Af Amer: >90 mL/min (ref >90)
GFR calc non Af Amer: >90 mL/min (ref >90)
Glucose, Bld: 120 mg/dL — ABNORMAL HIGH (ref 70–99)
POTASSIUM: 4.5 meq/L (ref 3.7–5.3)
Sodium: 137 mEq/L (ref 137–147)
TOTAL PROTEIN: 8.3 g/dL (ref 6.0–8.3)
Total Bilirubin: 0.3 mg/dL (ref 0.3–1.2)

## 2014-02-24 LAB — CBC WITH DIFFERENTIAL/PLATELET
BASOS PCT: 0 % (ref 0–1)
Basophils Absolute: 0 10*3/uL (ref 0.0–0.1)
EOS ABS: 0.1 10*3/uL (ref 0.0–0.7)
Eosinophils Relative: 0 % (ref 0–5)
HCT: 34.6 % — ABNORMAL LOW (ref 39.0–52.0)
HEMOGLOBIN: 11.2 g/dL — AB (ref 13.0–17.0)
Lymphocytes Relative: 13 % (ref 12–46)
Lymphs Abs: 1.5 10*3/uL (ref 0.7–4.0)
MCH: 26.7 pg (ref 26.0–34.0)
MCHC: 32.4 g/dL (ref 30.0–36.0)
MCV: 82.4 fL (ref 78.0–100.0)
Monocytes Absolute: 0.3 10*3/uL (ref 0.1–1.0)
Monocytes Relative: 3 % (ref 3–12)
NEUTROS ABS: 9.5 10*3/uL — AB (ref 1.7–7.7)
NEUTROS PCT: 84 % — AB (ref 43–77)
Platelets: 616 10*3/uL — ABNORMAL HIGH (ref 150–400)
RBC: 4.2 MIL/uL — ABNORMAL LOW (ref 4.22–5.81)
RDW: 13.1 % (ref 11.5–15.5)
WBC: 11.3 10*3/uL — ABNORMAL HIGH (ref 4.0–10.5)

## 2014-02-24 NOTE — Patient Instructions (Signed)
Bronislaus Jeff  02/24/2014   Your procedure is scheduled on:  02/28/2014  Report to [facility] at  615  AM.  Call this number if you have problems the morning of surgery: [phone]   Remember:   Do not eat food or drink liquids after midnight.   Take these medicines the morning of surgery with A SIP OF WATER:  hydrocodone   Do not wear jewelry, make-up or nail polish.  Do not wear lotions, powders, or perfumes.   Do not shave 48 hours prior to surgery. Men may shave face and neck.  Do not bring valuables to the hospital.  [facility] is not responsible for any belongings or valuables.               Contacts, dentures or bridgework may not be worn into surgery.  Leave suitcase in the car. After surgery it may be brought to your room.  For patients admitted to the hospital, discharge time is determined by your treatment team.               Patients discharged the day of surgery will not be allowed to drive home.  Name and phone number of your driver: family  Special Instructions: Shower using CHG 2 nights before surgery and the night before surgery.  If you shower the day of surgery use CHG.  Use special wash - you have one bottle of CHG for all showers.  You should use approximately 1/3 of the bottle for each shower.   Please read over the following fact sheets that you were given: Pain Booklet, Coughing and Deep Breathing, Surgical Site Infection Prevention, Anesthesia Post-op Instructions and Care and Recovery After Surgery Arthroscopic Procedure, Knee An arthroscopic procedure can find what is wrong with your knee. PROCEDURE Arthroscopy is a surgical technique that allows your orthopedic surgeon to diagnose and treat your knee injury with accuracy. They will look into your knee through a small instrument. This is almost like a small (pencil sized) telescope. Because arthroscopy affects your knee less than open knee surgery, you can anticipate a more rapid recovery.  Taking an active role by following your caregiver's instructions will help with rapid and complete recovery. Use crutches, rest, elevation, ice, and knee exercises as instructed. The length of recovery depends on various factors including type of injury, age, physical condition, medical conditions, and your rehabilitation. Your knee is the joint between the large bones (femur and tibia) in your leg. Cartilage covers these bone ends which are smooth and slippery and allow your knee to bend and move smoothly. Two menisci, thick, semi-lunar shaped pads of cartilage which form a rim inside the joint, help absorb shock and stabilize your knee. Ligaments bind the bones together and support your knee joint. Muscles move the joint, help support your knee, and take stress off the joint itself. Because of this all programs and physical therapy to rehabilitate an injured or repaired knee require rebuilding and strengthening your muscles. AFTER THE PROCEDURE  After the procedure, you will be moved to a recovery area until most of the effects of the medication have worn off. Your caregiver will discuss the test results with you.  Only take over-the-counter or prescription medicines for pain, discomfort, or fever as directed by your caregiver. SEEK MEDICAL CARE IF:   You have increased bleeding from your wounds.  You see redness, swelling, or have increasing pain in your wounds.  You have pus coming from your wound.  You have an oral temperature above 102 F (38.9 C).  You notice a bad smell coming from the wound or dressing.  You have severe pain with any motion of your knee. SEEK IMMEDIATE MEDICAL CARE IF:   You develop a rash.  You have difficulty breathing.  You have any allergic problems. Document Released: 09/12/2000 Document Revised: 12/08/2011 Document Reviewed: 04/05/2008 [facility] Patient Information 2014 [employer]. PATIENT INSTRUCTIONS POST-ANESTHESIA  IMMEDIATELY FOLLOWING  SURGERY:  Do not drive or operate machinery for the first twenty four hours after surgery.  Do not make any important decisions for twenty four hours after surgery or while taking narcotic pain medications or sedatives.  If you develop intractable nausea and vomiting or a severe headache please notify your doctor immediately.  FOLLOW-UP:  Please make an appointment with your surgeon as instructed. You do not need to follow up with anesthesia unless specifically instructed to do so.  WOUND CARE INSTRUCTIONS (if applicable):  Keep a dry clean dressing on the anesthesia/puncture wound site if there is drainage.  Once the wound has quit draining you may leave it open to air.  Generally you should leave the bandage intact for twenty four hours unless there is drainage.  If the epidural site drains for more than 36-48 hours please call the anesthesia department.  QUESTIONS?:  Please feel free to call your physician or the hospital operator if you have any questions, and they will be happy to assist you.

## 2014-02-27 NOTE — H&P (Signed)
Rodney Bryant is an 38 y.o. male.   Chief Complaint: Left knee pain HPI: He hurt his left knee at work about three months ago.  It has been hurting since then and getting worse. He has giving way, swelling, popping and pain.  He went to the ER and had a MRI on 02/14/14.  MRI showed tear of the medial and lateral menisci and also a MCL strain.  He had an effusion as well.  I saw him in my office recently and told him about need for arthroscopy.  He has not improved with conservative therapy.  He appears to understand the risks and imponderables of the elective procedure.  No past medical history on file.  Past Surgical History  Procedure Laterality Date  . Boil      lanced at [facility]    Family History  Problem Relation Age of Onset  . Diabetes Other    Social History:  reports that he quit smoking about 4 months ago. His smoking use included Cigarettes. He has a .45 pack-year smoking history. He has never used smokeless tobacco. He reports that he does not drink alcohol or use illicit drugs.  Allergies: No Known Allergies  No prescriptions prior to admission    No results found for this or any previous visit (from the past 48 hour(s)). No results found.  Review of Systems  Musculoskeletal: Positive for joint pain (Pain left knee).  All other systems reviewed and are negative.   There were no vitals taken for this visit. Physical Exam  Constitutional: He is oriented to person, place, and time. He appears well-developed and well-nourished.  HENT:  Head: Normocephalic and atraumatic.  Eyes: Conjunctivae and EOM are normal. Pupils are equal, round, and reactive to light.  Neck: Normal range of motion. Neck supple.  Cardiovascular: Normal rate, regular rhythm, normal heart sounds and intact distal pulses.   Respiratory: Effort normal and breath sounds normal.  GI: Soft.  Musculoskeletal: He exhibits tenderness (Pain left knee, effusion, positive McMurray medially, MCL 1+ opening  medially, ROM 0 to 100 with pain.).       Left knee: He exhibits decreased range of motion and effusion. Tenderness found. Medial joint line and MCL tenderness noted.       Legs: Neurological: He is alert and oriented to person, place, and time. He has normal reflexes.  Skin: Skin is warm and dry.  Psychiatric: He has a normal mood and affect. His behavior is normal. Judgment and thought content normal.     Assessment/Plan Tear of the left knee medial and lateral menisci.  Strain of the MCL.  For elective outpatient arthroscopy and partial menisectomies.  He will need physical therapy post operative as an outpatient.  Darreld Mclean 02/27/2014, 12:10 PM

## 2014-02-28 ENCOUNTER — Encounter (HOSPITAL_COMMUNITY)
Admission: RE | Disposition: A | Discharge status: Home / Self Care | Payer: Self-pay | Source: Ambulatory Visit | Attending: Orthopaedic Surgery

## 2014-02-28 ENCOUNTER — Ambulatory Visit (HOSPITAL_COMMUNITY): Payer: Self-pay | Admitting: Anesthesiology

## 2014-02-28 ENCOUNTER — Encounter (HOSPITAL_COMMUNITY): Payer: Self-pay | Admitting: Anesthesiology

## 2014-02-28 ENCOUNTER — Ambulatory Visit (HOSPITAL_COMMUNITY)
Admission: RE | Admit: 2014-02-28 | Discharge: 2014-02-28 | Disposition: A | Discharge status: Home / Self Care | Payer: Self-pay | Source: Ambulatory Visit | Attending: Orthopaedic Surgery | Admitting: Orthopaedic Surgery

## 2014-02-28 ENCOUNTER — Encounter (HOSPITAL_COMMUNITY): Payer: Self-pay | Admitting: *Deleted

## 2014-02-28 DIAGNOSIS — Y9269 Other specified industrial and construction area as the place of occurrence of the external cause: Secondary | ICD-10-CM | POA: Insufficient documentation

## 2014-02-28 DIAGNOSIS — S83509A Sprain of unspecified cruciate ligament of unspecified knee, initial encounter: Secondary | ICD-10-CM | POA: Insufficient documentation

## 2014-02-28 DIAGNOSIS — X58XXXA Exposure to other specified factors, initial encounter: Secondary | ICD-10-CM | POA: Insufficient documentation

## 2014-02-28 DIAGNOSIS — IMO0002 Reserved for concepts with insufficient information to code with codable children: Secondary | ICD-10-CM | POA: Insufficient documentation

## 2014-02-28 DIAGNOSIS — Z87891 Personal history of nicotine dependence: Secondary | ICD-10-CM | POA: Insufficient documentation

## 2014-02-28 DIAGNOSIS — S83289A Other tear of lateral meniscus, current injury, unspecified knee, initial encounter: Secondary | ICD-10-CM | POA: Insufficient documentation

## 2014-02-28 HISTORY — PX: KNEE ARTHROSCOPY WITH LATERAL MENISECTOMY: SHX6193

## 2014-02-28 HISTORY — PX: KNEE ARTHROSCOPY WITH MEDIAL MENISECTOMY: SHX5651

## 2014-02-28 HISTORY — PX: KNEE ARTHROSCOPY: SHX127

## 2014-02-28 SURGERY — ARTHROSCOPY, KNEE
Anesthesia: General | Site: Knee | Laterality: Left

## 2014-02-28 MED ORDER — CHLORHEXIDINE GLUCONATE 4 % EX LIQD: 60.0000 mL | Freq: Once | CUTANEOUS | Status: DC

## 2014-02-28 MED ORDER — FENTANYL CITRATE 0.05 MG/ML IJ SOLN
25.0000 ug | INTRAMUSCULAR | Status: DC | PRN
  Administered 2014-02-28 (×2): 50 ug via INTRAVENOUS

## 2014-02-28 MED ORDER — ONDANSETRON HCL 4 MG/2ML IJ SOLN: 4.0000 mg | Freq: Once | INTRAMUSCULAR | Status: DC | PRN

## 2014-02-28 MED ORDER — GLYCOPYRROLATE 0.2 MG/ML IJ SOLN
INTRAMUSCULAR | Status: AC
  Filled 2014-02-28: qty 1

## 2014-02-28 MED ORDER — BUPIVACAINE HCL (PF) 0.25 % IJ SOLN
INTRAMUSCULAR | Status: AC
  Filled 2014-02-28: qty 30

## 2014-02-28 MED ORDER — ONDANSETRON HCL 4 MG/2ML IJ SOLN
4.0000 mg | Freq: Once | INTRAMUSCULAR | Status: AC
  Administered 2014-02-28: 4 mg via INTRAVENOUS

## 2014-02-28 MED ORDER — FENTANYL CITRATE 0.05 MG/ML IJ SOLN
INTRAMUSCULAR | Status: AC
  Filled 2014-02-28: qty 2

## 2014-02-28 MED ORDER — LACTATED RINGERS IR SOLN
Status: DC | PRN
  Administered 2014-02-28 (×2): 3000 mL

## 2014-02-28 MED ORDER — PROPOFOL 10 MG/ML IV BOLUS
INTRAVENOUS | Status: DC | PRN
  Administered 2014-02-28: 150 mg via INTRAVENOUS

## 2014-02-28 MED ORDER — BUPIVACAINE HCL (PF) 0.25 % IJ SOLN
INTRAMUSCULAR | Status: DC | PRN
  Administered 2014-02-28: 30 mL

## 2014-02-28 MED ORDER — LIDOCAINE HCL 1 % IJ SOLN
INTRAMUSCULAR | Status: DC | PRN
  Administered 2014-02-28: 30 mg via INTRADERMAL

## 2014-02-28 MED ORDER — MIDAZOLAM HCL 2 MG/2ML IJ SOLN
INTRAMUSCULAR | Status: AC
  Filled 2014-02-28: qty 2

## 2014-02-28 MED ORDER — ONDANSETRON HCL 4 MG/2ML IJ SOLN
INTRAMUSCULAR | Status: AC
  Filled 2014-02-28: qty 2

## 2014-02-28 MED ORDER — FENTANYL CITRATE 0.05 MG/ML IJ SOLN
25.0000 ug | INTRAMUSCULAR | Status: AC
  Administered 2014-02-28 (×2): 25 ug via INTRAVENOUS

## 2014-02-28 MED ORDER — PROPOFOL 10 MG/ML IV BOLUS
INTRAVENOUS | Status: AC
  Filled 2014-02-28: qty 20

## 2014-02-28 MED ORDER — HYDROCODONE-ACETAMINOPHEN 7.5-325 MG PO TABS: 1.0000 | ORAL_TABLET | ORAL | Status: DC | PRN

## 2014-02-28 MED ORDER — LACTATED RINGERS IV SOLN
INTRAVENOUS | Status: DC
  Administered 2014-02-28: 07:00:00 via INTRAVENOUS

## 2014-02-28 MED ORDER — FENTANYL CITRATE 0.05 MG/ML IJ SOLN
INTRAMUSCULAR | Status: AC
  Filled 2014-02-28: qty 5

## 2014-02-28 MED ORDER — FENTANYL CITRATE 0.05 MG/ML IJ SOLN
INTRAMUSCULAR | Status: DC | PRN
  Administered 2014-02-28: 25 ug via INTRAVENOUS
  Administered 2014-02-28: 50 ug via INTRAVENOUS
  Administered 2014-02-28 (×5): 25 ug via INTRAVENOUS

## 2014-02-28 MED ORDER — GLYCOPYRROLATE 0.2 MG/ML IJ SOLN
0.2000 mg | Freq: Once | INTRAMUSCULAR | Status: AC
  Administered 2014-02-28: 0.2 mg via INTRAVENOUS

## 2014-02-28 MED ORDER — MIDAZOLAM HCL 2 MG/2ML IJ SOLN
1.0000 mg | INTRAMUSCULAR | Status: DC | PRN
  Administered 2014-02-28 (×2): 2 mg via INTRAVENOUS

## 2014-02-28 MED ORDER — 0.9 % SODIUM CHLORIDE (POUR BTL) OPTIME
TOPICAL | Status: DC | PRN
  Administered 2014-02-28: 1000 mL

## 2014-02-28 SURGICAL SUPPLY — 58 items
BAG HAMPER (MISCELLANEOUS) ×3 IMPLANT
BANDAGE ELASTIC 6 VELCRO NS (GAUZE/BANDAGES/DRESSINGS) ×3 IMPLANT
BANDAGE ESMARK 4X12 BL STRL LF (DISPOSABLE) ×1 IMPLANT
BLADE SURG 15 STRL LF DISP TIS (BLADE) ×1 IMPLANT
BLADE SURG 15 STRL SS (BLADE) ×2
BLADE SURG SZ11 CARB STEEL (BLADE) ×3 IMPLANT
BNDG ESMARK 4X12 BLUE STRL LF (DISPOSABLE) ×3
CLOTH BEACON ORANGE TIMEOUT ST (SAFETY) ×3 IMPLANT
CUFF TOURNIQUET SINGLE 34IN LL (TOURNIQUET CUFF) ×3 IMPLANT
CUFF TOURNIQUET SINGLE 44IN (TOURNIQUET CUFF) IMPLANT
CUTTER TOMCAT 4.0M (BURR) ×3 IMPLANT
CUTTER TOMCAT 5.0MM (BURR) ×3 IMPLANT
DECANTER SPIKE VIAL GLASS SM (MISCELLANEOUS) ×3 IMPLANT
DRAPE PROXIMA HALF (DRAPES) ×3 IMPLANT
DRSG XEROFORM 1X8 (GAUZE/BANDAGES/DRESSINGS) ×6 IMPLANT
DURAPREP 26ML APPLICATOR (WOUND CARE) ×6 IMPLANT
ELECT MENISCUS 165MM 90D (ELECTRODE) IMPLANT
FORMALIN 10 PREFIL 480ML (MISCELLANEOUS) ×3 IMPLANT
GAUZE SPONGE 4X4 12PLY STRL (GAUZE/BANDAGES/DRESSINGS) ×3 IMPLANT
GAUZE SPONGE 4X4 16PLY XRAY LF (GAUZE/BANDAGES/DRESSINGS) ×3 IMPLANT
GLOVE BIO SURGEON STRL SZ8 (GLOVE) ×3 IMPLANT
GLOVE BIO SURGEON STRL SZ8.5 (GLOVE) ×3 IMPLANT
GLOVE BIOGEL PI IND STRL 7.0 (GLOVE) ×1 IMPLANT
GLOVE BIOGEL PI INDICATOR 7.0 (GLOVE) ×2
GLOVE ECLIPSE 6.5 STRL STRAW (GLOVE) ×3 IMPLANT
GLOVE EXAM NITRILE MD LF STRL (GLOVE) ×6 IMPLANT
GLOVE SKINSENSE NS SZ8.0 LF (GLOVE) ×2
GLOVE SKINSENSE STRL SZ8.0 LF (GLOVE) ×1 IMPLANT
GOWN STRL REUS W/TWL LRG LVL3 (GOWN DISPOSABLE) ×3 IMPLANT
GOWN STRL REUS W/TWL XL LVL3 (GOWN DISPOSABLE) ×3 IMPLANT
HANDPIECE (MISCELLANEOUS) IMPLANT
HLDR LEG FOAM (MISCELLANEOUS) ×1 IMPLANT
IV LACTATED RINGER IRRG 3000ML (IV SOLUTION) ×4
IV LR IRRIG 3000ML ARTHROMATIC (IV SOLUTION) ×2 IMPLANT
KIT BLADEGUARD II DBL (SET/KITS/TRAYS/PACK) ×3 IMPLANT
KIT ROOM TURNOVER AP CYSTO (KITS) ×3 IMPLANT
KNIFE HOOK (BLADE) IMPLANT
LEG HOLDER FOAM (MISCELLANEOUS) ×2
MANIFOLD NEPTUNE II (INSTRUMENTS) ×3 IMPLANT
MARKER SKIN DUAL TIP RULER LAB (MISCELLANEOUS) ×3 IMPLANT
NEEDLE HYPO 21X1.5 SAFETY (NEEDLE) ×3 IMPLANT
NEEDLE SPNL 18GX3.5 QUINCKE PK (NEEDLE) ×3 IMPLANT
NS IRRIG 1000ML POUR BTL (IV SOLUTION) ×3 IMPLANT
PACK ARTHRO LIMB DRAPE STRL (MISCELLANEOUS) ×3 IMPLANT
PAD ABD 5X9 TENDERSORB (GAUZE/BANDAGES/DRESSINGS) ×3 IMPLANT
PAD ARMBOARD 7.5X6 YLW CONV (MISCELLANEOUS) ×3 IMPLANT
PADDING CAST COTTON 6X4 STRL (CAST SUPPLIES) ×3 IMPLANT
PADDING WEBRIL 6 STERILE (GAUZE/BANDAGES/DRESSINGS) ×3 IMPLANT
SET ARTHROSCOPY INST (INSTRUMENTS) ×3 IMPLANT
SET BASIN LINEN APH (SET/KITS/TRAYS/PACK) ×3 IMPLANT
SPONGE GAUZE 4X4 12PLY (GAUZE/BANDAGES/DRESSINGS) ×2 IMPLANT
SUT ETHILON 3 0 FSL (SUTURE) ×3 IMPLANT
SYR 30ML LL (SYRINGE) ×3 IMPLANT
TIP 0 DEGREE (MISCELLANEOUS) IMPLANT
TIP 30 DEGREE (MISCELLANEOUS) IMPLANT
TIP 70 DEGREE (MISCELLANEOUS) IMPLANT
TUBING FLOSTEADY ARTHRO PUMP (IRRIGATION / IRRIGATOR) ×3 IMPLANT
YANKAUER SUCT BULB TIP 10FT TU (MISCELLANEOUS) ×6 IMPLANT

## 2014-02-28 NOTE — Discharge Instructions (Signed)
Use crutches.  Weight bear as tolerated.  You may discontinue crutches when pain is less and less.  You may use ice for the knee as needed.  You may remove the dressings to the left knee when you desire.  You may rinse with peroxide but otherwise keep the knee dry.  You may apply Band-aids to the stitches to protect them.  You should have an appointment for physical therapy.  Go to physical therapy and follow their instructions.  If any problem, call Dr. Sanjuan Dame office at [phone].  If after hours, call the hospital at [phone].  Keep appointment to see Dr. Hilda Lias in about ten days.

## 2014-02-28 NOTE — Anesthesia Preprocedure Evaluation (Signed)
Anesthesia Evaluation  Patient identified by MRN, date of birth, ID band Patient awake    Reviewed: Allergy & Precautions, H&P , NPO status , Patient's Chart, lab work & pertinent test results  Airway Mallampati: II TM Distance: >3 FB Neck ROM: Full    Dental  (+) Teeth Intact   Pulmonary neg pulmonary ROS, former smoker,  breath sounds clear to auscultation        Cardiovascular negative cardio ROS  Rhythm:Regular Rate:Normal     Neuro/Psych    GI/Hepatic negative GI ROS,   Endo/Other    Renal/GU      Musculoskeletal   Abdominal   Peds  Hematology   Anesthesia Other Findings   Reproductive/Obstetrics                           Anesthesia Physical Anesthesia Plan  ASA: I  Anesthesia Plan: General   Post-op Pain Management:    Induction: Intravenous  Airway Management Planned: LMA  Additional Equipment:   Intra-op Plan:   Post-operative Plan: Extubation in OR  Informed Consent: I have reviewed the patients History and Physical, chart, labs and discussed the procedure including the risks, benefits and alternatives for the proposed anesthesia with the patient or authorized representative who has indicated his/her understanding and acceptance.     Plan Discussed with:   Anesthesia Plan Comments:         Anesthesia Quick Evaluation

## 2014-02-28 NOTE — Anesthesia Postprocedure Evaluation (Signed)
  Anesthesia Post-op Note  Patient: Rodney Bryant  Procedure(s) Performed: Procedure(s): ARTHROSCOPY KNEE (Left) KNEE ARTHROSCOPY WITH LATERAL MENISECTOMY (Left) KNEE ARTHROSCOPY WITH MEDIAL MENISECTOMY (Left)  Patient Location: PACU  Anesthesia Type:General  Level of Consciousness: awake and alert   Airway and Oxygen Therapy: Patient Spontanous Breathing  Post-op Pain: none  Post-op Assessment: Post-op Vital signs reviewed, Patient's Cardiovascular Status Stable, Respiratory Function Stable, Patent Airway and No signs of Nausea or vomiting  Post-op Vital Signs: Reviewed and stable  Last Vitals:  Filed Vitals:   02/28/14 0725  BP: 136/96  Pulse:   Temp:   Resp: 20    Complications: No apparent anesthesia complications

## 2014-02-28 NOTE — Progress Notes (Signed)
The History and Physical is unchanged. I have examined the patient. The patient is medically able to have surgery on the left knee . Darreld Mclean

## 2014-02-28 NOTE — Transfer of Care (Signed)
Immediate Anesthesia Transfer of Care Note  Patient: Rodney Bryant  Procedure(s) Performed: Procedure(s): ARTHROSCOPY KNEE (Left) KNEE ARTHROSCOPY WITH LATERAL MENISECTOMY (Left) KNEE ARTHROSCOPY WITH MEDIAL MENISECTOMY (Left)  Patient Location: PACU  Anesthesia Type:General  Level of Consciousness: awake, alert  and oriented  Airway & Oxygen Therapy: Patient Spontanous Breathing and Patient connected to face mask oxygen  Post-op Assessment: Report given to PACU RN  Post vital signs: Reviewed and stable  Complications: No apparent anesthesia complications

## 2014-02-28 NOTE — Brief Op Note (Signed)
02/28/2014  8:39 AM  PATIENT:  Rodney Bryant  38 y.o. male  PRE-OPERATIVE DIAGNOSIS:  tear medial and lateral meniscus left knee  POST-OPERATIVE DIAGNOSIS:  tear medial and lateral meniscus left knee  PROCEDURE:  Procedure(s): ARTHROSCOPY KNEE (Left) KNEE ARTHROSCOPY WITH LATERAL MENISECTOMY (Left) KNEE ARTHROSCOPY WITH MEDIAL MENISECTOMY (Left)  SURGEON:  Surgeon(s) and Role:    * Darreld Mclean, MD - Primary  PHYSICIAN ASSISTANT:   ASSISTANTS: none   ANESTHESIA:   general  EBL:  Total I/O In: 500 [I.V.:500] Out: 0   BLOOD ADMINISTERED:none  DRAINS: none   LOCAL MEDICATIONS USED:  MARCAINE     SPECIMEN:  Source of Specimen:  Medial and lateral menisci shavings  DISPOSITION OF SPECIMEN:  PATHOLOGY  COUNTS:  YES  TOURNIQUET:   Total Tourniquet Time Documented: Thigh (Left) - 36 minutes Total: Thigh (Left) - 36 minutes   DICTATION: .Other Dictation: Dictation Number [id number]  PLAN OF CARE: Discharge to home after PACU  PATIENT DISPOSITION:  PACU - hemodynamically stable.   Delay start of Pharmacological VTE agent (>24hrs) due to surgical blood loss or risk of bleeding: not applicable

## 2014-02-28 NOTE — Anesthesia Procedure Notes (Signed)
Procedure Name: LMA Insertion Date/Time: 02/28/2014 7:38 AM Performed by: Glynn Octave E Pre-anesthesia Checklist: Patient identified, Patient being monitored, Emergency Drugs available, Timeout performed and Suction available Patient Re-evaluated:Patient Re-evaluated prior to inductionOxygen Delivery Method: Circle System Utilized Preoxygenation: Pre-oxygenation with 100% oxygen Intubation Type: IV induction Ventilation: Mask ventilation without difficulty LMA: LMA inserted LMA Size: 4.0 Number of attempts: 1 Placement Confirmation: positive ETCO2 and breath sounds checked- equal and bilateral

## 2014-03-01 NOTE — Op Note (Signed)
NAMEBODEN, VALLEAU             ACCOUNT NO.:  [id number]  MEDICAL RECORD NO.:  [id number]  LOCATION:  [facility]                          FACILITY:  [facility]  PHYSICIAN:  J. Darreld Mclean, M.D. DATE OF BIRTH:  09/22/1976  DATE OF PROCEDURE: DATE OF DISCHARGE:  02/28/2014                              OPERATIVE REPORT   PREOPERATIVE DIAGNOSIS:  Tear of the medial and lateral meniscus of the left knee, medial collateral ligament strain.  POSTOPERATIVE DIAGNOSIS:  Tear of the medial and lateral meniscus of the left knee, medial collateral ligament strain.  PROCEDURE:  Operative arthroscopy of the left knee, and partial medial and lateral meniscectomy.  SURGEON:  J. Darreld Mclean, M.D.  ANESTHESIA:  General.  TOURNIQUET TIME:  36 minutes.  DRAINS:  None.  SPECIMENS:  Sent to Pathology.  INDICATIONS:  The patient has been having pain and tenderness in the knee for about 3 months after an injury at work.  It has got progressively worse, has giving way of the knee pain.  He was seen in the ER several weeks ago and an MRI was done showing a tear of the medial and lateral meniscus and MCL strain.  I saw him in my time office last week, surgery was recommended.  I explained to him since he has not gotten better with conservative treatment, his knee is getting worse, his swelling giving way, surgery is indicated.  I went over the risks and imponderables with him.  He appeared to understand and agreed to the procedure as outlined.  DESCRIPTION OF PROCEDURE:  The patient was seen in the holding area, the left knee was identified as a correct surgical site.  I placed a mark on the left knee.  He was brought to the operating room, given general anesthesia while supine on the operating room table.  Tourniquet and leg holder placed, deflated the left upper thigh.  He was prepped and draped in the usual manner.  I had a generalized time-out identifying the patient as [name], doing his  left knee for medial and lateral meniscectomy and arthroscopy. All instrumentations were properly positioned and working.  The OR team knew each other.  Leg was wrapped circumferentially with an Esmarch bandage.  Tourniquet was inflated to 300 mmHg.  Esmarch bandage was removed.  Inflow cannula was inserted medially.  Lactated Ringer's instilled into the knee by an infusion pump.  Arthroscope was inserted laterally.  The knee was systematically examined.  Suprapatellar pouch had some mild synovitis with some slight bloody changes in the synovium from our blood had been that was not actively bleeding.  Attention was directed to the medial side.  The medial meniscus had a tear from the most medial horn all the way posteriorly.  So, I can complete bucket-handle tear.  Articular surface had grade 2 changes.  Anterior cruciate was intact.  Again, there was some slight bloody staining of the synovium.  Laterally, there was a tear of the anterior horn of the meniscus.  Attention was directed back to the medial side, and using a meniscal shaver and meniscal punch, the meniscus was removed.  Again, it was so little and complete bucket- handle tear.  Good contour  was obtained.  Permanent pictures were taken throughout the procedure.  Laterally, the meniscus anteriorly was removed and got a good smooth contour, posterior portion of the lateral meniscus looked good.  Permanent pictures were taken.  Knee was systematically reexamined and no pathology found.  Knee was irrigated with remaining part of lactated Ringer's.  Wound was reapproximated with 3-0 nylon interrupted vertical mattress manner.  Marcaine 0.25% instilled in each portal.  Tourniquet was deflated after 36 minutes. Sterile dressing applied.  The patient tolerated the procedure well and will go to recovery in good condition.  I will see him in the office in 2 weeks.  If any difficulties, contact me through the office hospital beeper  system.  Appropriate analgesia had been given for pain.          ______________________________ Shela CommonsJ. Darreld McleanWayne Ediel Unangst, M.D.     JWK/MEDQ  D:  02/28/2014  T:  03/01/2014  Job:  [id number]

## 2014-03-02 ENCOUNTER — Encounter (HOSPITAL_COMMUNITY): Payer: Self-pay | Admitting: Orthopaedic Surgery

## 2014-03-03 ENCOUNTER — Ambulatory Visit (HOSPITAL_COMMUNITY)
Admission: RE | Admit: 2014-03-03 | Discharge: 2014-03-03 | Disposition: A | Discharge status: Home / Self Care | Payer: Self-pay | Source: Ambulatory Visit | Attending: Orthopaedic Surgery | Admitting: Orthopaedic Surgery

## 2014-03-03 DIAGNOSIS — IMO0001 Reserved for inherently not codable concepts without codable children: Secondary | ICD-10-CM | POA: Insufficient documentation

## 2014-03-03 DIAGNOSIS — R262 Difficulty in walking, not elsewhere classified: Secondary | ICD-10-CM | POA: Insufficient documentation

## 2014-03-03 DIAGNOSIS — M25673 Stiffness of unspecified ankle, not elsewhere classified: Secondary | ICD-10-CM | POA: Insufficient documentation

## 2014-03-03 DIAGNOSIS — M6281 Muscle weakness (generalized): Secondary | ICD-10-CM | POA: Insufficient documentation

## 2014-03-03 DIAGNOSIS — Z9889 Other specified postprocedural states: Secondary | ICD-10-CM | POA: Insufficient documentation

## 2014-03-03 DIAGNOSIS — M25569 Pain in unspecified knee: Secondary | ICD-10-CM | POA: Insufficient documentation

## 2014-03-03 DIAGNOSIS — M25676 Stiffness of unspecified foot, not elsewhere classified: Secondary | ICD-10-CM | POA: Insufficient documentation

## 2014-03-03 DIAGNOSIS — M25669 Stiffness of unspecified knee, not elsewhere classified: Secondary | ICD-10-CM | POA: Insufficient documentation

## 2014-03-03 NOTE — Evaluation (Signed)
Physical Therapy Evaluation  Patient Details  Name: Rodney Bryant MRN: [id number] Date of Birth: Feb 06, 1976  Today's Date: 03/03/2014 Time: 0940-1020 PT Time Calculation (min): 40 min Charge: evaluation.             Visit#: 1 of 1  Re-eval:   Assessment Diagnosis: Lt arthroscopic surgery Surgical Date: 02/28/14 Next MD Visit: 03/10/2014 Prior Therapy: no  Past Medical History: No past medical history on file. Past Surgical History:  Past Surgical History  Procedure Laterality Date  . Boil      lanced at [facility]  . Knee arthroscopy Left 02/28/2014    Procedure: ARTHROSCOPY KNEE;  Surgeon: Darreld Mclean, MD;  Location: [facility];  Service: Orthopedics;  Laterality: Left;  . Knee arthroscopy with lateral menisectomy Left 02/28/2014    Procedure: KNEE ARTHROSCOPY WITH LATERAL MENISECTOMY;  Surgeon: Darreld Mclean, MD;  Location: [facility];  Service: Orthopedics;  Laterality: Left;  . Knee arthroscopy with medial menisectomy Left 02/28/2014    Procedure: KNEE ARTHROSCOPY WITH MEDIAL MENISECTOMY;  Surgeon: Darreld Mclean, MD;  Location: [facility];  Service: Orthopedics;  Laterality: Left;    Subjective Symptoms/Limitations Symptoms: Mr. Rodney Bryant states he injured his knee in January.  He opted to have surgery on 02/28/2014 after the MRI showed torn meniscus.  He states that since the surgery he feels much better.  He is still walking with his crutches.  He states that he twisted his right ankle this morning and needs to stay on the crutches for this reason.  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  He has been referred to therapy to improve his functional conditon.   How long can you sit comfortably?: 5 minutes How long can you stand comfortably?: 10 minutes How long can you walk comfortably?: Pt has not really tried to walk except in the house since the surgery Pain Assessment Currently in Pain?: Yes (Goes as high as a 7 or 8) Pain Score: 3  Pain Location:  Knee Pain Orientation: Left Pain Type: Surgical pain Pain Onset: More than a month ago Pain Frequency: Intermittent Pain Relieving Factors: rest Effect of Pain on Daily Activities: increases   Balance Screening  no falls        Sensation/Coordination/Flexibility/Functional Tests Functional Tests Functional Tests: foto  Assessment RLE AROM (degrees) Right Knee Extension: 10 Right Knee Flexion: 90 RLE Strength Right Hip Flexion: 5/5 Right Hip Extension: 5/5 Right Hip ABduction: 5/5 Right Hip ADduction: 3+/5 Right Knee Flexion: 3/5 Right Knee Extension: 3+/5  Exercise/Treatments  Stretches Active Hamstring Stretch: 3 reps;30 seconds Seated Long Arc Quad: 5 reps Supine Quad Sets: 5 reps Heel Slides: 5 reps Sidelying Hip ADduction: 5 reps Prone  Hamstring Curl: 5 reps    Physical Therapy Assessment and Plan PT Assessment and Plan Clinical Impression Statement: Pt s/p Lt arthroscopic surgery with decreased strength, decreaaed ROM and pain who has also sprained his Rt ankle.  Pt has been referred to therapy but pt request to recieve HEP due to not having any insurance and not being able to afford treatment.   PT Plan: Pt to be discharged    Goals Home Exercise Program Pt/caregiver will Perform Home Exercise Program: For increased strengthening;For increased ROM PT Short Term Goals Time to Complete Short Term Goals: 2 weeks PT Short Term Goal 1: ROM to be 5 to 105 to allow improved gt and the abilty to sit for 30 minutes without pain PT  Short Term Goal 2: Pt to be able to stand for 15 minutes to be able to make a meal PT Short Term Goal 3: Pt to be walking with one crutch PT Long Term Goals Time to Complete Long Term Goals: 4 weeks PT Long Term Goal 1: Pt to be I in advance HEP PT Long Term Goal 2: Pt to be able to stand for 40 minutes with comfort Long Term Goal 3: Pt to be able to walk without crutch  Long Term Goal 4: Pt ROM wnl to be able to sqaut to pick  items off the floor.    Problem List Patient Active Problem List   Diagnosis Date Noted  . Difficulty walking 03/03/2014  . Stiffness of joint, not elsewhere classified, lower leg 03/03/2014    PT Plan of Care PT Home Exercise Plan: Given for Lt knee strengthening and ROM as well as Rt ankle   GP    Bella KennedyCynthia J Russell 03/03/2014, 12:03 PM  Physician Documentation Your signature is required to indicate approval of the treatment plan as stated above.  Please sign and either send electronically or make a copy of this report for your files and return this physician signed original.   Please mark one 1.__approve of plan  2. ___approve of plan with the following conditions.   ______________________________                                                          _____________________ Physician Signature                                                                                                             Date

## 2015-02-19 ENCOUNTER — Other Ambulatory Visit (HOSPITAL_COMMUNITY): Payer: Self-pay | Admitting: Physician Assistant

## 2015-02-19 ENCOUNTER — Ambulatory Visit (HOSPITAL_COMMUNITY)
Admission: RE | Admit: 2015-02-19 | Discharge: 2015-02-19 | Disposition: A | Discharge status: Home / Self Care | Payer: Self-pay | Source: Ambulatory Visit | Attending: Physician Assistant | Admitting: Physician Assistant

## 2015-02-19 DIAGNOSIS — R079 Chest pain, unspecified: Secondary | ICD-10-CM | POA: Insufficient documentation

## 2015-02-19 DIAGNOSIS — T1490XA Injury, unspecified, initial encounter: Secondary | ICD-10-CM

## 2015-02-19 DIAGNOSIS — R222 Localized swelling, mass and lump, trunk: Secondary | ICD-10-CM | POA: Insufficient documentation

## 2015-07-20 ENCOUNTER — Other Ambulatory Visit: Payer: Self-pay | Admitting: Physician Assistant

## 2015-07-20 LAB — COMPREHENSIVE METABOLIC PANEL
ALT: 38 U/L (ref 9–46)
AST: 30 U/L (ref 10–40)
Albumin: 4 g/dL (ref 3.6–5.1)
Alkaline Phosphatase: 120 U/L — ABNORMAL HIGH (ref 40–115)
BILIRUBIN TOTAL: 0.5 mg/dL (ref 0.2–1.2)
BUN: 12 mg/dL (ref 7–25)
CO2: 25 mmol/L (ref 20–31)
Calcium: 9 mg/dL (ref 8.6–10.3)
Chloride: 100 mmol/L (ref 98–110)
Creat: 0.7 mg/dL (ref 0.60–1.35)
GLUCOSE: 86 mg/dL (ref 65–99)
Potassium: 4.5 mmol/L (ref 3.5–5.3)
Sodium: 137 mmol/L (ref 135–146)
Total Protein: 7.1 g/dL (ref 6.1–8.1)

## 2015-07-20 LAB — LIPID PANEL
CHOL/HDL RATIO: 5.7 ratio — AB (ref <=5.0)
Cholesterol: 222 mg/dL — ABNORMAL HIGH (ref 125–200)
HDL: 39 mg/dL — AB (ref >=40)
LDL Cholesterol: 141 mg/dL — ABNORMAL HIGH (ref <130)
Triglycerides: 211 mg/dL — ABNORMAL HIGH (ref <150)
VLDL: 42 mg/dL — ABNORMAL HIGH (ref <30)

## 2015-07-26 ENCOUNTER — Encounter: Payer: Self-pay | Admitting: Physician Assistant

## 2015-07-26 ENCOUNTER — Ambulatory Visit: Payer: Self-pay | Admitting: Physician Assistant

## 2015-07-26 VITALS — BP 116/70 | HR 48 | Temp 98.1°F | Ht 65.0 in | Wt 203.1 lb

## 2015-07-26 DIAGNOSIS — E785 Hyperlipidemia, unspecified: Secondary | ICD-10-CM

## 2015-07-26 DIAGNOSIS — I10 Essential (primary) hypertension: Secondary | ICD-10-CM

## 2015-07-26 NOTE — Patient Instructions (Signed)
Dieta restringida en grasas y colesterol (Fat and Cholesterol Restricted Diet) El exceso de grasas y colesterol en la dieta puede causar problemas de salud. Esta dieta lo ayudar a mantener las grasas y el colesterol en los niveles normales para evitar enfermarse. QU TIPOS DE GRASAS DEBO ELEGIR?  Elija grasas monosaturadas y polinsaturadas. Estas se encuentran en alimentos como el aceite de oliva, aceite de canola, semillas de lino, nueces, almendras y semillas.  Consuma ms grasas omega-3. Las mejores opciones incluyen salmn, caballa, sardinas, atn, aceite de lino y semillas de lino molidas.  Limite el consumo de grasas saturadas, que se encuentran en productos de origen animal, como carnes, mantequilla y crema. Tambin pueden estar en productos vegetales, como aceite de palma, de palmiste y de coco.   Evite los alimentos con aceites parcialmente hidrogenados. Estos contienen grasas trans. Entre los ejemplos de alimentos con grasas trans se incluyen margarinas en barra, algunas margarinas untables, galletas dulces o saladas y otros productos horneados. QU PAUTAS GENERALES DEBO SEGUIR?   Lea las etiquetas de los alimentos. Busque las palabras "grasas trans" y "grasas saturadas".  Al preparar una comida:  Llene la mitad del plato con verduras y ensaladas de hojas verdes.  Llene un cuarto del plato con cereales integrales. Busque la palabra "integral" en el primer lugar de la lista de ingredientes.  Llene un cuarto del plato con alimentos con protenas magras.  Limite las frutas a dos porciones por da. Elija frutas en lugar de jugos.  Coma ms alimentos con fibra soluble, por ejemplo, manzanas, brcoli, zanahorias, frijoles, guisantes y cebada. Trate de consumir de 20a 30g (gramos) de fibra por da.  Coma ms comidas caseras. Coma menos en los restaurantes y los bares.  Limite o evite el alcohol.  Limite los alimentos con alto contenido de almidn y azcar.  Limite el consumo  de alimentos fritos.  Cocine los alimentos sin frerlos. Las opciones de coccin ms adecuadas son hornear, hervir, grillar y asar a la parrilla.  Baje de peso si es necesario. Aunque pierda poco peso, esto puede ser importante para la salud general. Tambin puede ayudar a prevenir enfermedades como diabetes y enfermedad cardaca. QU ALIMENTOS PUEDO COMER? Cereales Cereales integrales, como los panes de salvado o integrales, las galletas, los cereales y las pastas. Avena sin endulzar, trigo, cebada, quinua o arroz integral. Tortillas de harina de maz o de salvado. Verduras Verduras frescas o congeladas (crudas, al vapor, asadas o grilladas). Ensaladas de hojas verdes. Frutas Frutas frescas, en conserva (en su jugo natural) o frutas congeladas. Carnes y otros productos con protenas Carne de res molida (al 85% o ms magra), carne de res de animales alimentados con pastos o carne de res sin la grasa. Pollo o pavo sin piel. Carne de pollo o de pavo molida. Cerdo sin la grasa. Todos los pescados y frutos de mar. Huevos. Porotos, guisantes o lentejas secos. Frutos secos o semillas sin sal. Frijoles secos o en lata sin sal. Lcteos Productos lcteos con bajo contenido de grasas, como leche descremada o al 1%, quesos reducidos en grasas o al 2%, ricota con bajo contenido de grasas o queso cottage, o yogur natural con bajo contenido de grasas. Grasas y aceites Margarinas untables que no contengan grasas trans. Mayonesa y condimentos para ensaladas livianos o reducidos en grasas. [place]. Aceites de oliva, canola, ssamo o crtamo. Mantequilla natural de cacahuate o almendra (elija la que no tenga agregado de aceite o azcar). Los artculos mencionados arriba pueden no ser una lista completa   de las bebidas o los alimentos recomendados. Comunquese con el nutricionista para conocer ms opciones. QU ALIMENTOS NO SE RECOMIENDAN? Cereales Pan blanco. Pastas blancas. Arroz blanco. Pan de maz.  Bagels, pasteles y croissants. Galletas saladas que contengan grasas trans. Verduras Papas blancas. Maz. Verduras con crema o fritas. Verduras en salsa de queso. Frutas Frutas secas. Fruta enlatada en almbar liviano o espeso. Jugo de frutas. Carnes y otros productos con protenas Cortes de carne con grasa. Costillas, alas de pollo, tocineta, salchicha, mortadela, salame, chinchulines, tocino, perros calientes, salchichas alemanas y embutidos envasados. Hgado y otros rganos. Lcteos Leche entera o al 2%, crema, mezcla de leche y crema, y queso crema. Quesos enteros. Yogur entero o endulzado. Quesos con toda su grasa. Cremas no lcteas y coberturas batidas. Quesos procesados, quesos para untar o cuajadas. Dulces y postres Jarabe de maz, azcares, miel y melazas. Caramelos. Mermelada y jalea. Jarabe. Cereales endulzados. Galletas, pasteles, bizcochuelos, donas, muffins y helado. Grasas y aceites Mantequilla, margarina en barra, manteca de cerdo, grasa, mantequilla clarificada o grasa de tocino. Aceites de coco, de palmiste o de palma. Bebidas Alcohol. Bebidas endulzadas (como refrescos, limonadas y bebidas frutales o ponches). Los artculos mencionados arriba pueden no ser una lista completa de las bebidas y los alimentos que se deben evitar. Comunquese con el nutricionista para obtener ms informacin.   Esta informacin no tiene como fin reemplazar el consejo del mdico. Asegrese de hacerle al mdico cualquier pregunta que tenga.   Document Released: 09/15/2005 Document Revised: 10/06/2014 Elsevier Interactive Patient Education 2016 [employer] Inc.  

## 2015-07-26 NOTE — Progress Notes (Signed)
   BP 116/70 mmHg  Pulse 48  Temp(Src) 98.1 F (36.7 C)  Ht 5\' 5"  (1.651 m)  Wt 203 lb 1.6 oz (92.126 kg)  BMI 33.80 kg/m2  SpO2 97%   Subjective:    Patient ID: Rodney Bryant, male    DOB: March 31, 1976, 39 y.o.   MRN: [id number]  HPI: Rodney Bryant is a 39 y.o. male presenting on 07/26/2015 for Hypertension and Hyperlipidemia   HPI   Pt out of his fish oil for about 3 wk Feels well. Denies problems  Relevant past medical, surgical, family and social history reviewed and updated as indicated. Interim medical history since our last visit reviewed. Allergies and medications reviewed and updated.  Current outpatient prescriptions:  .  atenolol (TENORMIN) 100 MG tablet, Take 100 mg by mouth daily., Disp: , Rfl:  .  simvastatin (ZOCOR) 20 MG tablet, Take 20 mg by mouth daily., Disp: , Rfl:    Review of Systems  Constitutional: Negative for fever, chills, diaphoresis, appetite change, fatigue and unexpected weight change.  HENT: Positive for sneezing. Negative for congestion, dental problem, drooling, ear pain, facial swelling, hearing loss, mouth sores, sore throat, trouble swallowing and voice change.   Eyes: Negative for pain, discharge, redness, itching and visual disturbance.  Respiratory: Negative for cough, choking, shortness of breath and wheezing.   Cardiovascular: Negative for chest pain, palpitations and leg swelling.  Gastrointestinal: Negative for vomiting, abdominal pain, diarrhea, constipation and blood in stool.  Endocrine: Negative for cold intolerance, heat intolerance and polydipsia.  Genitourinary: Negative for dysuria, hematuria and decreased urine volume.  Musculoskeletal: Positive for back pain. Negative for arthralgias and gait problem.  Skin: Negative for rash.  Allergic/Immunologic: Negative for environmental allergies.  Neurological: Negative for seizures, syncope, light-headedness and headaches.  Hematological: Negative for adenopathy.   Psychiatric/Behavioral: Negative for suicidal ideas, dysphoric mood and agitation. The patient is not nervous/anxious.     Per HPI unless specifically indicated above     Objective:    BP 116/70 mmHg  Pulse 48  Temp(Src) 98.1 F (36.7 C)  Ht 5\' 5"  (1.651 m)  Wt 203 lb 1.6 oz (92.126 kg)  BMI 33.80 kg/m2  SpO2 97%  Wt Readings from Last 3 Encounters:  07/26/15 203 lb 1.6 oz (92.126 kg)  02/24/14 173 lb (78.472 kg)  02/14/14 180 lb (81.647 kg)    Physical Exam  Constitutional: He is oriented to person, place, and time. He appears well-developed and well-nourished.  HENT:  Head: Normocephalic and atraumatic.  Neck: Neck supple.  Cardiovascular: Normal rate and regular rhythm.   Pulmonary/Chest: Effort normal and breath sounds normal. He has no wheezes.  Abdominal: Soft. Bowel sounds are normal. There is no tenderness.  Musculoskeletal: He exhibits no edema.  Lymphadenopathy:    He has no cervical adenopathy.  Neurological: He is alert and oriented to person, place, and time.  Skin: Skin is warm and dry.  Psychiatric: He has a normal mood and affect. His behavior is normal.  Vitals reviewed.       Assessment & Plan:   Encounter Diagnoses  Name Primary?  . Essential hypertension, benign Yes  . Hyperlipemia      -Reviewed labs with pt  -Cont current meds -Get back on fish oil. Watch lowfat diet- gave pt another lowfat diet handout -F/u OV 3 mo. rto sooner prn

## 2015-07-27 DIAGNOSIS — I10 Essential (primary) hypertension: Secondary | ICD-10-CM | POA: Insufficient documentation

## 2015-07-27 DIAGNOSIS — E785 Hyperlipidemia, unspecified: Secondary | ICD-10-CM | POA: Insufficient documentation

## 2015-08-09 ENCOUNTER — Encounter: Payer: Self-pay | Admitting: Physician Assistant

## 2015-09-26 ENCOUNTER — Other Ambulatory Visit: Payer: Self-pay | Admitting: Physician Assistant

## 2015-10-09 ENCOUNTER — Other Ambulatory Visit: Payer: Self-pay | Admitting: Physician Assistant

## 2015-10-11 ENCOUNTER — Other Ambulatory Visit: Payer: Self-pay | Admitting: Physician Assistant

## 2015-10-19 ENCOUNTER — Other Ambulatory Visit: Payer: Self-pay | Admitting: Physician Assistant

## 2015-10-25 ENCOUNTER — Ambulatory Visit: Payer: Self-pay | Admitting: Physician Assistant

## 2015-11-07 ENCOUNTER — Ambulatory Visit: Payer: Self-pay | Admitting: Physician Assistant

## 2015-11-07 ENCOUNTER — Encounter: Payer: Self-pay | Admitting: Physician Assistant

## 2015-11-07 VITALS — BP 146/92 | HR 60 | Temp 98.1°F | Ht 65.0 in | Wt 200.0 lb

## 2015-11-07 DIAGNOSIS — E785 Hyperlipidemia, unspecified: Secondary | ICD-10-CM

## 2015-11-07 DIAGNOSIS — E669 Obesity, unspecified: Secondary | ICD-10-CM

## 2015-11-07 DIAGNOSIS — I10 Essential (primary) hypertension: Secondary | ICD-10-CM

## 2015-11-07 MED ORDER — ATORVASTATIN CALCIUM 20 MG PO TABS: 20.0000 mg | ORAL_TABLET | Freq: Every day | ORAL | Status: DC

## 2015-11-07 NOTE — Patient Instructions (Signed)
Get fasing labs drawn tomorrow Stay on atenolol Get back on fish oil We will order atorvastatin for you (replaces simvastatin)

## 2015-11-07 NOTE — Progress Notes (Signed)
   BP 146/92 mmHg  Pulse 60  Temp(Src) 98.1 F (36.7 C)  Ht  (1.651 m)  Wt 200 lb (90.719 kg)  BMI 33.28 kg/m2  SpO2 96%   Subjective:    Patient ID: Rodney Bryant, male    DOB: 1976-08-19, 40 y.o.   MRN: [id number]  HPI: Rodney Bryant is a 40 y.o. male presenting on 11/07/2015 for Hypertension and Hyperlipidemia   HPI   Pt is not taking his fish oil  Relevant past medical, surgical, family and social history reviewed and updated as indicated. Interim medical history since our last visit reviewed. Allergies and medications reviewed and updated.  Current outpatient prescriptions:  .  atenolol (TENORMIN) 100 MG tablet, Take 1 tablet po qd for blood pressure.  Tome una tableta por boca diaria, Disp: 30 tablet, Rfl: 2 .  GuaiFENesin (MUCINEX PO), Take 1 tablet by mouth 2 (two) times daily as needed., Disp: , Rfl:  .  simvastatin (ZOCOR) 20 MG tablet, 1 po qhs for cholesterol.  Tome una tableta por boca al dormir, Disp: 30 tablet, Rfl: 3   Review of Systems  Respiratory: Negative for shortness of breath.   Cardiovascular: Negative for chest pain and leg swelling.  Gastrointestinal: Negative for abdominal pain.    Per HPI unless specifically indicated above     Objective:    BP 146/92 mmHg  Pulse 60  Temp(Src) 98.1 F (36.7 C)  Ht  (1.651 m)  Wt 200 lb (90.719 kg)  BMI 33.28 kg/m2  SpO2 96%  Wt Readings from Last 3 Encounters:  11/07/15 200 lb (90.719 kg)  07/26/15 203 lb 1.6 oz (92.126 kg)  02/24/14 173 lb (78.472 kg)    Physical Exam  Constitutional: He is oriented to person, place, and time. He appears well-developed and well-nourished.  HENT:  Head: Normocephalic and atraumatic.  Neck: Neck supple.  Cardiovascular: Normal rate and regular rhythm.   Pulmonary/Chest: Effort normal and breath sounds normal. He has no wheezes.  Abdominal: Soft. Bowel sounds are normal. There is no hepatosplenomegaly. There is no tenderness.  Musculoskeletal: He  exhibits no edema.  Lymphadenopathy:    He has no cervical adenopathy.  Neurological: He is alert and oriented to person, place, and time.  Skin: Skin is warm and dry.  Psychiatric: He has a normal mood and affect. His behavior is normal.  Vitals reviewed.       Assessment & Plan:   Encounter Diagnoses  Name Primary?  . Essential hypertension, benign Yes  . Hyperlipidemia   . Obesity, unspecified      -Stop simvastatin. Start lipitor -Pt thinks his bp up b/c he just woke up and he had a nightmare. -pt to get Fasting labs tomorrow.- will call with results -f/u 3 months.  RTO sooner prn

## 2015-11-10 LAB — COMPLETE METABOLIC PANEL WITH GFR
ALT: 24 U/L (ref 9–46)
AST: 20 U/L (ref 10–40)
Albumin: 4.2 g/dL (ref 3.6–5.1)
Alkaline Phosphatase: 128 U/L — ABNORMAL HIGH (ref 40–115)
BUN: 9 mg/dL (ref 7–25)
CO2: 28 mmol/L (ref 20–31)
Calcium: 9.5 mg/dL (ref 8.6–10.3)
Chloride: 102 mmol/L (ref 98–110)
Creat: 0.8 mg/dL (ref 0.60–1.35)
GFR, Est African American: >89 mL/min (ref >=60)
Glucose, Bld: 93 mg/dL (ref 65–99)
POTASSIUM: 4.6 mmol/L (ref 3.5–5.3)
Sodium: 140 mmol/L (ref 135–146)
Total Bilirubin: 0.5 mg/dL (ref 0.2–1.2)
Total Protein: 7.3 g/dL (ref 6.1–8.1)

## 2015-11-10 LAB — LIPID PANEL
Cholesterol: 259 mg/dL — ABNORMAL HIGH (ref 125–200)
HDL: 38 mg/dL — AB (ref >=40)
LDL CALC: 180 mg/dL — AB (ref <130)
Total CHOL/HDL Ratio: 6.8 Ratio — ABNORMAL HIGH (ref <=5.0)
Triglycerides: 207 mg/dL — ABNORMAL HIGH (ref <150)
VLDL: 41 mg/dL — AB (ref <30)

## 2015-12-24 ENCOUNTER — Other Ambulatory Visit: Payer: Self-pay | Admitting: Physician Assistant

## 2016-02-06 ENCOUNTER — Encounter: Payer: Self-pay | Admitting: Physician Assistant

## 2016-02-06 ENCOUNTER — Ambulatory Visit: Payer: Self-pay | Admitting: Physician Assistant

## 2016-02-06 VITALS — BP 124/76 | HR 41 | Temp 97.9°F | Ht 65.0 in | Wt 190.7 lb

## 2016-02-06 DIAGNOSIS — I10 Essential (primary) hypertension: Secondary | ICD-10-CM

## 2016-02-06 DIAGNOSIS — E785 Hyperlipidemia, unspecified: Secondary | ICD-10-CM

## 2016-02-06 DIAGNOSIS — E669 Obesity, unspecified: Secondary | ICD-10-CM

## 2016-02-06 NOTE — Progress Notes (Signed)
BP 124/76 mmHg  Pulse 41  Temp(Src) 97.9 F (36.6 C)  Ht  (1.651 m)  Wt 190 lb 11.2 oz (86.501 kg)  BMI 31.73 kg/m2  SpO2 98%   Subjective:    Patient ID: Rodney Bryant, male    DOB: 1976/09/08, 40 y.o.   MRN: [id number]  HPI: Rodney Bryant is a 40 y.o. male presenting on 02/06/2016 for Hyperlipidemia   HPI   Pt is working moving big trucks.  Pt is doing well.    Relevant past medical, surgical, family and social history reviewed and updated as indicated. Interim medical history since our last visit reviewed. Allergies and medications reviewed and updated.   Current outpatient prescriptions:  .  atenolol (TENORMIN) 100 MG tablet, 1 po qd for blood pressure.  Tome una tableta por boca diaria, Disp: 30 tablet, Rfl: 4 .  Omega-3 Fatty Acids (FISH OIL PO), Take 4 capsules by mouth., Disp: , Rfl:  .  simvastatin (ZOCOR) 20 MG tablet, 1 po qhs for cholesterol.  Tome una tableta por boca al dormir, Disp: 30 tablet, Rfl: 4 .  atorvastatin (LIPITOR) 20 MG tablet, Take 1 tablet (20 mg total) by mouth daily. Tome una tableta por boca al dormir (Patient not taking: Reported on 02/06/2016), Disp: 90 tablet, Rfl: 1   Review of Systems  Constitutional: Negative for fever, chills, diaphoresis, appetite change, fatigue and unexpected weight change.  HENT: Negative for congestion, dental problem, drooling, ear pain, facial swelling, hearing loss, mouth sores, sneezing, sore throat, trouble swallowing and voice change.   Eyes: Negative for pain, discharge, redness, itching and visual disturbance.  Respiratory: Negative for cough, choking, shortness of breath and wheezing.   Cardiovascular: Negative for chest pain, palpitations and leg swelling.  Gastrointestinal: Negative for vomiting, abdominal pain, diarrhea, constipation and blood in stool.  Endocrine: Negative for cold intolerance, heat intolerance and polydipsia.  Genitourinary: Negative for dysuria, hematuria and decreased  urine volume.  Musculoskeletal: Negative for back pain, arthralgias and gait problem.  Skin: Negative for rash.  Allergic/Immunologic: Negative for environmental allergies.  Neurological: Negative for seizures, syncope, light-headedness and headaches.  Hematological: Negative for adenopathy.  Psychiatric/Behavioral: Negative for suicidal ideas, dysphoric mood and agitation. The patient is not nervous/anxious.     Per HPI unless specifically indicated above     Objective:    BP 124/76 mmHg  Pulse 41  Temp(Src) 97.9 F (36.6 C)  Ht  (1.651 m)  Wt 190 lb 11.2 oz (86.501 kg)  BMI 31.73 kg/m2  SpO2 98%  Wt Readings from Last 3 Encounters:  02/06/16 190 lb 11.2 oz (86.501 kg)  11/07/15 200 lb (90.719 kg)  07/26/15 203 lb 1.6 oz (92.126 kg)    Physical Exam  Constitutional: He is oriented to person, place, and time. He appears well-developed and well-nourished.  HENT:  Head: Normocephalic and atraumatic.  Neck: Neck supple.  Cardiovascular: Normal rate and regular rhythm.   Pulmonary/Chest: Effort normal and breath sounds normal. He has no wheezes.  Abdominal: Soft. Bowel sounds are normal. There is no hepatosplenomegaly. There is no tenderness.  Musculoskeletal: He exhibits no edema.  Lymphadenopathy:    He has no cervical adenopathy.  Neurological: He is alert and oriented to person, place, and time.  Skin: Skin is warm and dry.  Psychiatric: He has a normal mood and affect. His behavior is normal.  Vitals reviewed.       Assessment & Plan:    Encounter Diagnoses  Name Primary?  Marland Kitchen  Essential hypertension, benign Yes  . Hyperlipidemia   . Obesity, unspecified      -Get fasting labs drawn today when leaves office.  Will call with results -Turn in medassist application (so he can get on atorvastatin) -F/u 3 months. RTO sooner prn

## 2016-02-07 LAB — COMPLETE METABOLIC PANEL WITH GFR
ALBUMIN: 4.3 g/dL (ref 3.6–5.1)
ALT: 20 U/L (ref 9–46)
AST: 20 U/L (ref 10–40)
Alkaline Phosphatase: 115 U/L (ref 40–115)
BUN: 15 mg/dL (ref 7–25)
CALCIUM: 9.3 mg/dL (ref 8.6–10.3)
CHLORIDE: 99 mmol/L (ref 98–110)
CO2: 28 mmol/L (ref 20–31)
Creat: 0.9 mg/dL (ref 0.60–1.35)
GFR, Est Non African American: >89 mL/min (ref >=60)
Glucose, Bld: 86 mg/dL (ref 65–99)
POTASSIUM: 4.2 mmol/L (ref 3.5–5.3)
Sodium: 137 mmol/L (ref 135–146)
Total Bilirubin: 0.7 mg/dL (ref 0.2–1.2)
Total Protein: 7.1 g/dL (ref 6.1–8.1)

## 2016-02-07 LAB — LIPID PANEL
CHOL/HDL RATIO: 4.4 ratio (ref <=5.0)
CHOLESTEROL: 214 mg/dL — AB (ref 125–200)
HDL: 49 mg/dL (ref >=40)
LDL Cholesterol: 138 mg/dL — ABNORMAL HIGH (ref <130)
TRIGLYCERIDES: 136 mg/dL (ref <150)
VLDL: 27 mg/dL (ref <30)

## 2016-03-30 IMAGING — US US ABDOMEN COMPLETE
1 series · 14 of 25 positions shown · non-contrast
Comparison: CT abdomen and pelvis 01/05/2014

CLINICAL DATA: Back and leg pain, abdominal pain

EXAM:
ULTRASOUND ABDOMEN COMPLETE

[Series 1: us abdomen complete · 0.22mm/px · 14 of 116 slices shown]
[im 1/116]
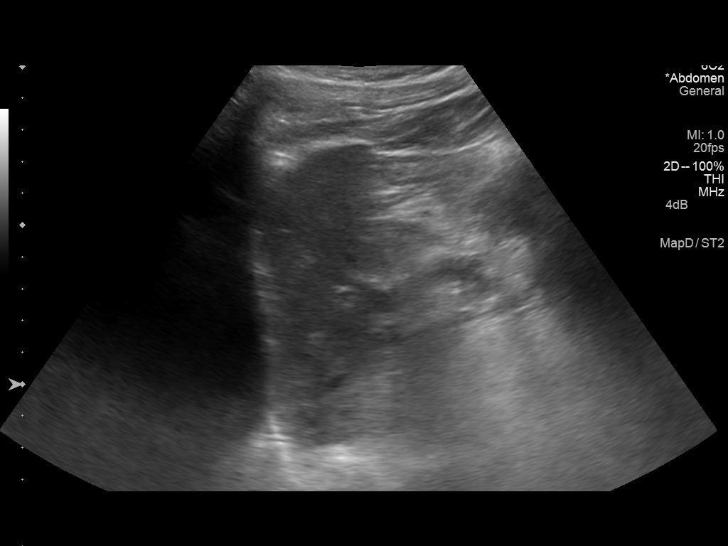
[im 10/116]
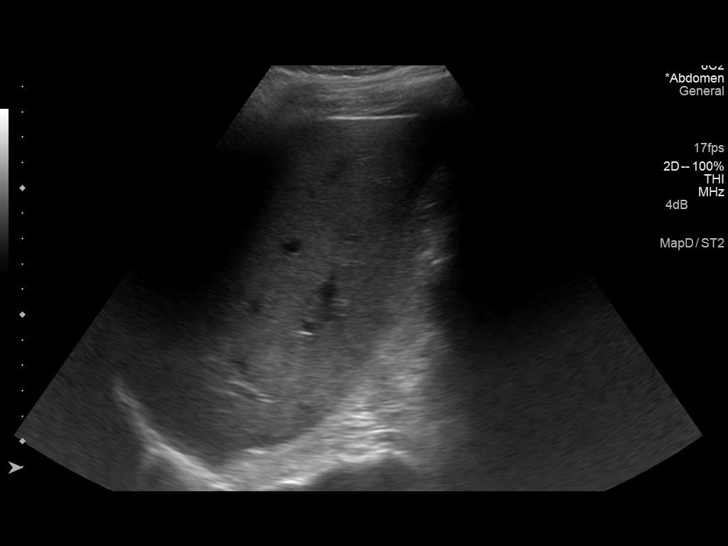
[im 20/116]
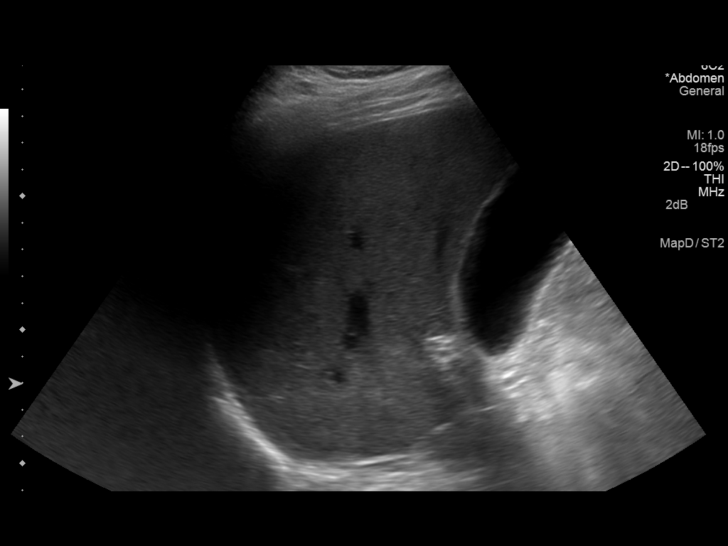
[im 29/116]
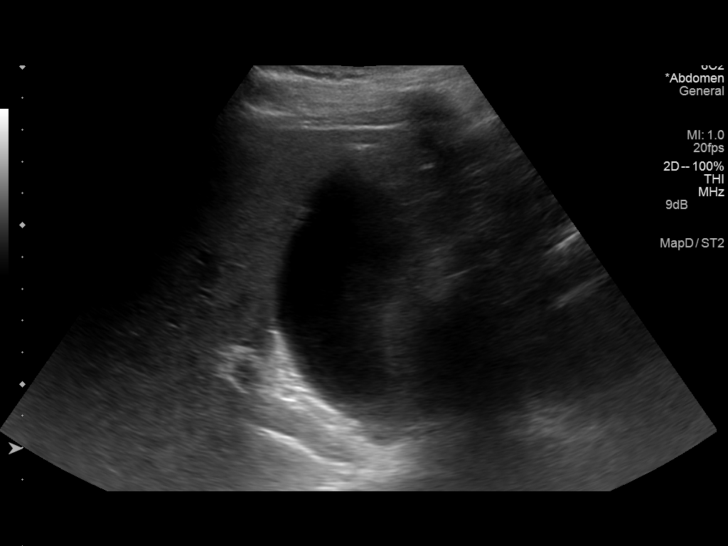
[im 39/116]
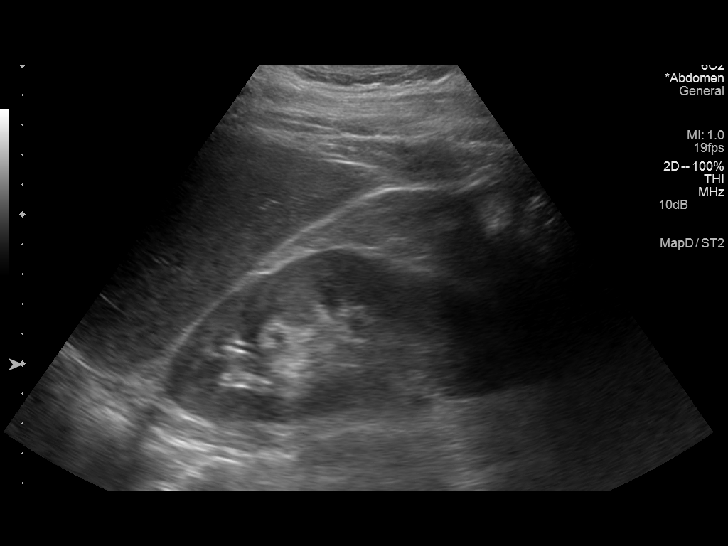
[im 44/116]
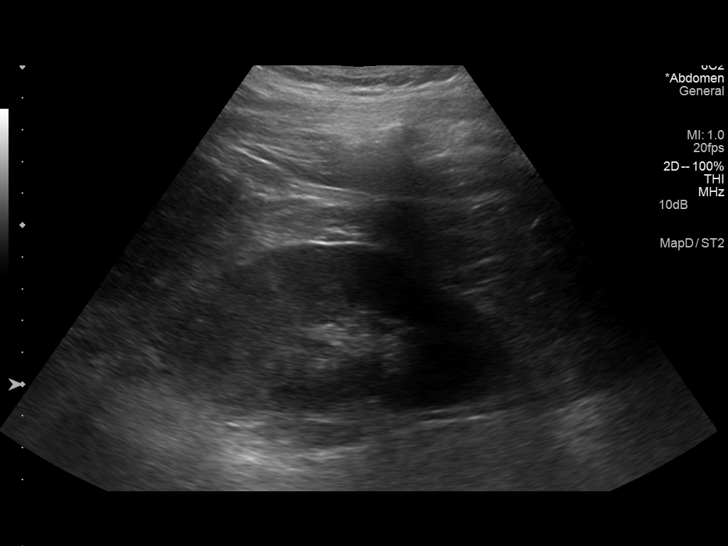
[im 53/116]
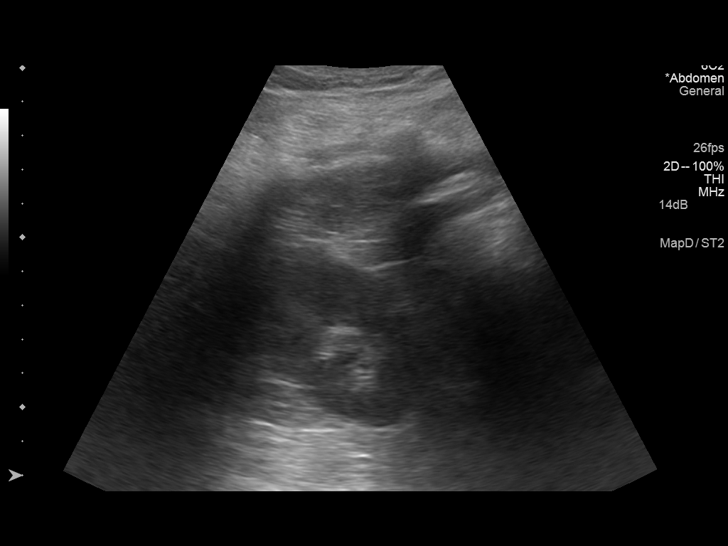
[im 63/116]
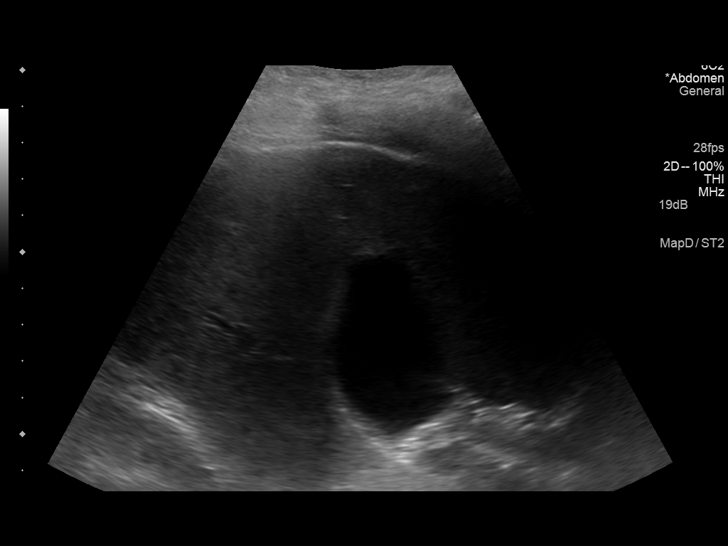
[im 72/116]
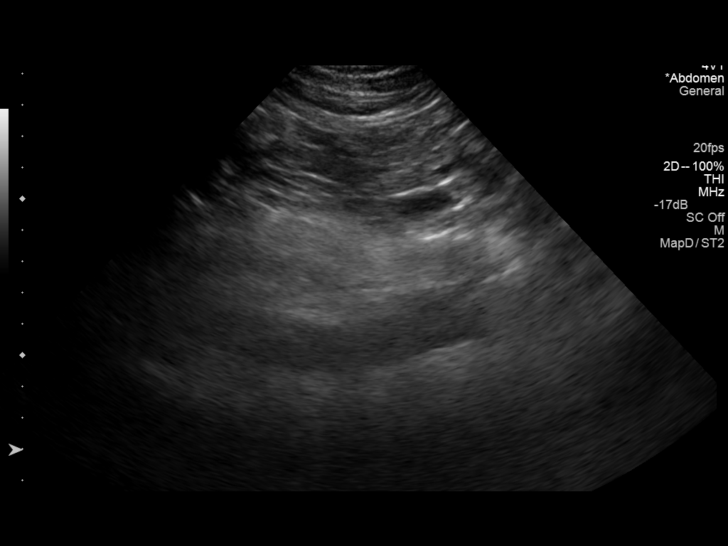
[im 77/116]
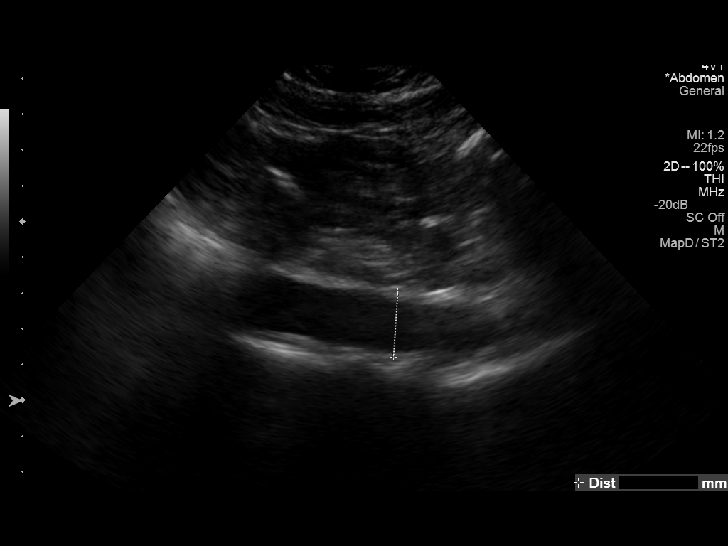
[im 87/116]
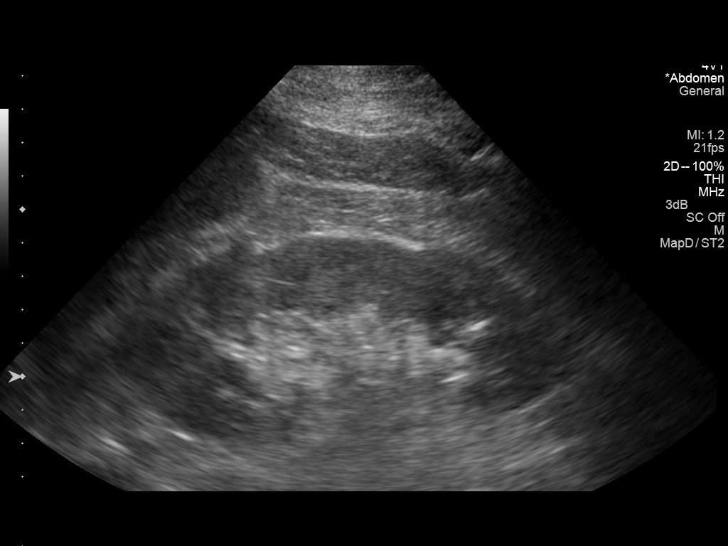
[im 96/116]
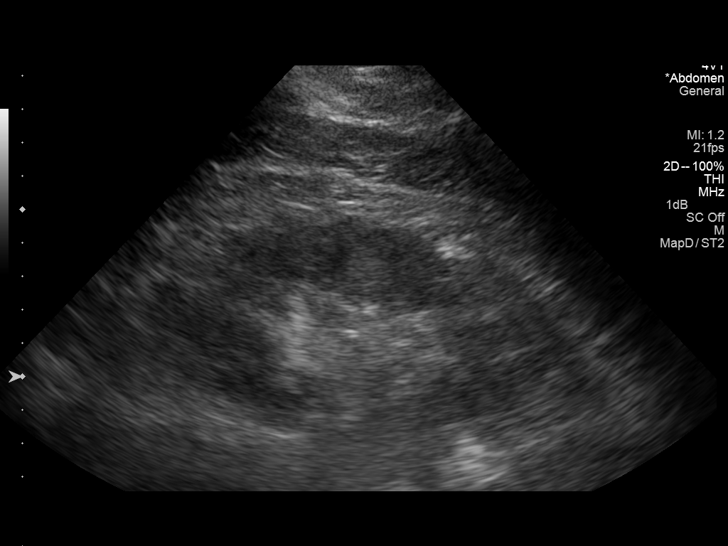
[im 106/116]
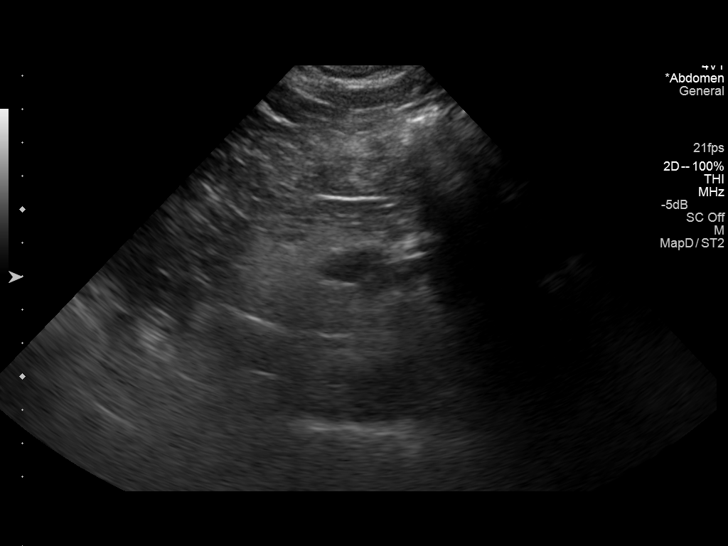
[im 116/116]
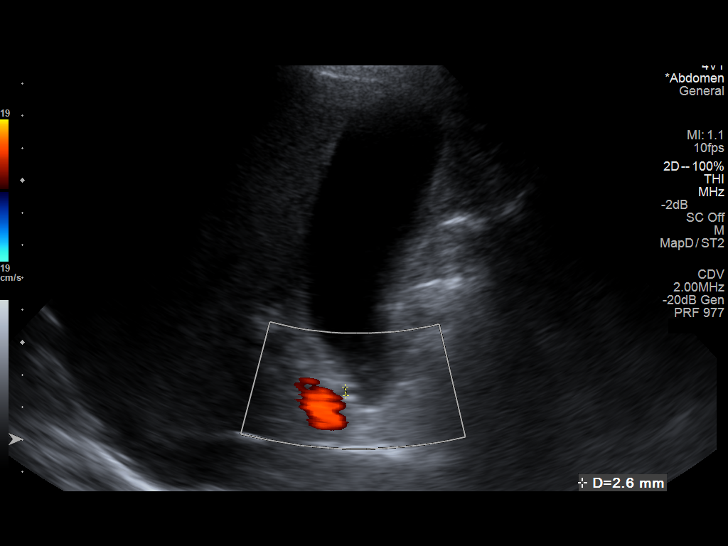

[14 of 25 positions shown; findings below may reference images not displayed]

FINDINGS: Gallbladder:

Mildly distended. No gallstones identified. No definite gallbladder
wall thickening, pericholecystic fluid or sonographic Murphy sign.

Common bile duct:

Diameter: Normal caliber 3 mm diameter

Liver:

Normal appearance.  Hepatopetal portal venous flow.

IVC:

Normal appearance

Pancreas:

Poorly visualized due to body habitus and primarily bowel gas.
Mildly heterogeneous echogenicity is seen but no definite mass or
calcification is identified on preceding CT.

Spleen:

Normal appearance, 9.0 cm length

Right Kidney:

Length: 11.8 cm.  Normal morphology without mass or hydronephrosis.

Left Kidney:

Length: 14.2 cm.  Normal morphology without mass or hydronephrosis.

Abdominal aorta:

Normal caliber

Other findings:

No free-fluid
IMPRESSION: No definite acute upper abdominal abnormalities identified with
limitations secondary to primarily bowel gas.

## 2016-05-07 ENCOUNTER — Other Ambulatory Visit: Payer: Self-pay | Admitting: Student

## 2016-05-07 DIAGNOSIS — E785 Hyperlipidemia, unspecified: Secondary | ICD-10-CM

## 2016-05-07 DIAGNOSIS — I10 Essential (primary) hypertension: Secondary | ICD-10-CM

## 2016-05-12 ENCOUNTER — Ambulatory Visit: Payer: Self-pay | Admitting: Physician Assistant

## 2016-05-22 ENCOUNTER — Encounter: Payer: Self-pay | Admitting: Physician Assistant

## 2016-12-10 ENCOUNTER — Other Ambulatory Visit: Payer: Self-pay | Admitting: Physician Assistant
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
## 2017-10-30 ENCOUNTER — Encounter (HOSPITAL_COMMUNITY): Payer: Self-pay | Admitting: Emergency Medicine

## 2017-10-30 ENCOUNTER — Emergency Department (HOSPITAL_COMMUNITY): Payer: Self-pay

## 2017-10-30 ENCOUNTER — Emergency Department (HOSPITAL_COMMUNITY)
Admission: EM | Admit: 2017-10-30 | Discharge: 2017-10-30 | Disposition: A | Discharge status: Home / Self Care | Payer: Self-pay | Attending: Emergency Medicine | Admitting: Emergency Medicine

## 2017-10-30 DIAGNOSIS — Z79899 Other long term (current) drug therapy: Secondary | ICD-10-CM | POA: Insufficient documentation

## 2017-10-30 DIAGNOSIS — F17210 Nicotine dependence, cigarettes, uncomplicated: Secondary | ICD-10-CM | POA: Insufficient documentation

## 2017-10-30 DIAGNOSIS — Y9389 Activity, other specified: Secondary | ICD-10-CM | POA: Insufficient documentation

## 2017-10-30 DIAGNOSIS — Y9289 Other specified places as the place of occurrence of the external cause: Secondary | ICD-10-CM | POA: Insufficient documentation

## 2017-10-30 DIAGNOSIS — I10 Essential (primary) hypertension: Secondary | ICD-10-CM | POA: Insufficient documentation

## 2017-10-30 DIAGNOSIS — E785 Hyperlipidemia, unspecified: Secondary | ICD-10-CM | POA: Insufficient documentation

## 2017-10-30 DIAGNOSIS — R52 Pain, unspecified: Secondary | ICD-10-CM

## 2017-10-30 DIAGNOSIS — Y33XXXA Other specified events, undetermined intent, initial encounter: Secondary | ICD-10-CM | POA: Insufficient documentation

## 2017-10-30 DIAGNOSIS — Y998 Other external cause status: Secondary | ICD-10-CM | POA: Insufficient documentation

## 2017-10-30 DIAGNOSIS — S46911A Strain of unspecified muscle, fascia and tendon at shoulder and upper arm level, right arm, initial encounter: Secondary | ICD-10-CM | POA: Insufficient documentation

## 2017-10-30 HISTORY — DX: Essential (primary) hypertension: I10

## 2017-10-30 MED ORDER — ATENOLOL 100 MG PO TABS: ORAL_TABLET | ORAL | 4 refills | Status: AC

## 2017-10-30 MED ORDER — IBUPROFEN 800 MG PO TABS
800.0000 mg | ORAL_TABLET | Freq: Once | ORAL | Status: AC
  Administered 2017-10-30: 800 mg via ORAL
  Filled 2017-10-30: qty 1

## 2017-10-30 MED ORDER — IBUPROFEN 600 MG PO TABS: 600.0000 mg | ORAL_TABLET | Freq: Four times a day (QID) | ORAL | 0 refills | Status: AC | PRN

## 2017-10-30 MED ORDER — METHOCARBAMOL 500 MG PO TABS: 500.0000 mg | ORAL_TABLET | Freq: Two times a day (BID) | ORAL | 0 refills | Status: AC | PRN

## 2017-10-30 MED ORDER — METHOCARBAMOL 500 MG PO TABS
500.0000 mg | ORAL_TABLET | Freq: Once | ORAL | Status: AC
  Administered 2017-10-30: 500 mg via ORAL
  Filled 2017-10-30: qty 1

## 2017-10-30 NOTE — ED Provider Notes (Signed)
[facility] EMERGENCY DEPARTMENT Provider Note   CSN: [id number] Arrival date & time: 10/30/17  0847     History   Chief Complaint Chief Complaint  Patient presents with  . Shoulder Pain    HPI Rodney Bryant is a 42 y.o. male.  HPI  The patient is a 42 year old male, he has a known history of high blood pressure but states he is out of his medications.  He reports that approximately 3 weeks ago a dog had gotten into his chickens and he was trying to chase the dog off by throwing a large piece of wood at it.  He states that as he threw it he had acute onset of pain in his right shoulder and his upper back, this has been persistent since that time and seemingly worsening.  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  He does endorse having intermittent numbness to his right forearm and has significant and severe pain with any movement of the right upper extremity.  The pain in his back initially started in the upper back but since is radiated down and now involves the bilateral lower back as well.  There is no problems with his legs from this and has no issues with numbness weakness or walking.  The patient states that the pain radiates from the right shoulder into the right trapezius as well as into the right chest.  He denies any shortness of breath fevers coughing or other symptoms.  He has had no medications prehospital other than some over-the-counter medications  Past Medical History:  Diagnosis Date  . Hypertension     Patient Active Problem List   Diagnosis Date Noted  . Obesity, unspecified 11/07/2015  . Essential hypertension, benign 07/27/2015  . Hyperlipidemia 07/27/2015  . Difficulty walking 03/03/2014  . Stiffness of joint, not elsewhere classified, lower leg 03/03/2014    Past Surgical History:  Procedure Laterality Date  . boil     [facility]  . KNEE  ARTHROSCOPY Left 02/28/2014   Procedure: ARTHROSCOPY KNEE;  Surgeon: Darreld McleanWayne Keeling, MD;  Location: [facility];  Service: Orthopedics;  Laterality: Left;  . KNEE ARTHROSCOPY WITH LATERAL MENISECTOMY Left 02/28/2014   Procedure: KNEE ARTHROSCOPY WITH LATERAL MENISECTOMY;  Surgeon: Darreld McleanWayne Keeling, MD;  Location: [facility];  Service: Orthopedics;  Laterality: Left;  . KNEE ARTHROSCOPY WITH MEDIAL MENISECTOMY Left 02/28/2014   Procedure: KNEE ARTHROSCOPY WITH MEDIAL MENISECTOMY;  Surgeon: Darreld McleanWayne Keeling, MD;  Location: [facility];  Service: Orthopedics;  Laterality: Left;       Home Medications    Prior to Admission medications   Medication Sig Start Date End Date Taking? Authorizing Provider  atenolol (TENORMIN) 100 MG tablet 1 po qd for blood pressure.  Tome una tableta por boca diaria 12/24/15   Jacquelin HawkingMcElroy, Shannon, PA-C  atorvastatin (LIPITOR) 20 MG tablet Take 1 tablet (20 mg total) by mouth daily. Tome una tableta por boca al dormir Patient not taking: Reported on 02/06/2016 11/07/15   Jacquelin HawkingMcElroy, Shannon, PA-C  Omega-3 Fatty Acids (FISH OIL PO) Take 4 capsules by mouth.    [provider]  simvastatin (ZOCOR) 20 MG tablet 1 po qhs for cholesterol.  Tome una tableta por boca al dormir 12/24/15   Jacquelin HawkingMcElroy, Shannon, PA-C    Family History Family History  Problem Relation Age of Onset  . Diabetes Other     Social History Social History  Tobacco Use  . Smoking status: Current Every Day Smoker    Packs/day: 0.03    Years: 15.00    Pack years: 0.45    Types: Cigarettes  . Smokeless tobacco: Never Used  Substance Use Topics  . Alcohol use: No    Comment: quit 3 mos ago  . Drug use: No     Allergies   Patient has no known allergies.   Review of Systems Review of Systems  All other systems reviewed and are negative.    Physical Exam Updated Vital Signs BP (!) 154/109 (BP Location: Right Arm)   Pulse 69   Temp 98 F (36.7 C) (Oral)   Resp 18   Ht 5\' 9"  (1.753 m)   Wt 86.2 kg (190 lb)    SpO2 100%   BMI 28.06 kg/m   Physical Exam  Constitutional: He appears well-developed and well-nourished. No distress.  HENT:  Head: Normocephalic and atraumatic.  Mouth/Throat: Oropharynx is clear and moist. No oropharyngeal exudate.  Eyes: Conjunctivae and EOM are normal. Pupils are equal, round, and reactive to light. Right eye exhibits no discharge. Left eye exhibits no discharge. No scleral icterus.  Neck: Normal range of motion. Neck supple. No JVD present. No thyromegaly present.  Cardiovascular: Normal rate, regular rhythm, normal heart sounds and intact distal pulses. Exam reveals no gallop and no friction rub.  No murmur heard. Pulmonary/Chest: Effort normal and breath sounds normal. No respiratory distress. He has no wheezes. He has no rales.  Abdominal: Soft. Bowel sounds are normal. He exhibits no distension and no mass. There is no tenderness.  Musculoskeletal: He exhibits tenderness and deformity. He exhibits no edema.  Bilateral lower extremities with normal range of motion in appearance, left upper extremity with good range of motion at all the major joints, supple joints and soft compartments diffusely.  Right upper extremity with severe restricted range of motion around the right shoulder with tenderness around the right shoulder girdle as well as the right anterior shoulder near the biceps groove, there is normal range of motion of the elbow with hand and the wrist.   Lymphadenopathy:    He has no cervical adenopathy.  Neurological: He is alert. Coordination normal.  Normal sensation and strength diffusely through the entire body, cranial nerves III through XII appear normal, speech is normal, gait is normal  Skin: Skin is warm and dry. No rash noted. No erythema.  No rashes or skin breakdown  Psychiatric: He has a normal mood and affect. His behavior is normal.  Nursing note and vitals reviewed.    ED Treatments / Results  Labs (all labs ordered are listed, but  only abnormal results are displayed) Labs Reviewed - No data to display   Radiology No results found.  Procedures Procedures (including critical care time)  Medications Ordered in ED Medications  ibuprofen (ADVIL,MOTRIN) tablet 800 mg (not administered)  methocarbamol (ROBAXIN) tablet 500 mg (not administered)     Initial Impression / Assessment and Plan / ED Course  I have reviewed the triage vital signs and the nursing notes.  Pertinent labs & imaging results that were available during my care of the patient were reviewed by me and considered in my medical decision making (see chart for details).     The patient looks very uncomfortable, he is compensating for this upper back pain and is now developed some lower back pain because of the way that he has been holding his arm and his shoulder.  I does  not look like an overt dislocation but the way he is holding his arm is strange and will need an x-ray of the shoulder as well as the cervical and thoracic spines as there is some tenderness over that C7-T1 and T2 area.  Most of the tenderness out into the trapezius muscles especially on the right side but it is bilateral.  There is no significant lower back tenderness  Imaging pending, pain medicines and Robaxin have been ordered.  Imaging negative for fractures or dislocations, the patient is stable for discharge.  Recommended anti-inflammatories muscle relaxants and orthopedic follow-up, patient expressed understanding  Final Clinical Impressions(s) / ED Diagnoses   Final diagnoses:  Shoulder strain, right, initial encounter    ED Discharge Orders        Ordered    ibuprofen (ADVIL,MOTRIN) 600 MG tablet  Every 6 hours PRN     10/30/17 1116    atenolol (TENORMIN) 100 MG tablet     10/30/17 1116    methocarbamol (ROBAXIN) 500 MG tablet  2 times daily PRN     10/30/17 1117       Eber Hong, MD 10/30/17 1117

## 2017-10-30 NOTE — ED Triage Notes (Signed)
Pt reports bilateral shoulder pain for 2 weeks after trying to chase off a dog with a stick that was getting his chickens.  States right shoulder worse than left.

## 2017-10-30 NOTE — ED Triage Notes (Signed)
Pt is out of his bp medications.

## 2017-10-30 NOTE — Discharge Instructions (Signed)
Xrays normal You have muscle strain - severe Ice, elevate - keep in sling Ibuprofen 600 mg by mouth every 8 hours as needed Robaxin, 500 mg twice a day as needed Dr. Romeo AppleHarrison in next 7 days for follow up

## 2017-11-04 ENCOUNTER — Other Ambulatory Visit: Payer: Self-pay

## 2017-11-04 ENCOUNTER — Emergency Department (HOSPITAL_COMMUNITY): Payer: Self-pay

## 2017-11-04 ENCOUNTER — Emergency Department (HOSPITAL_COMMUNITY)
Admission: EM | Admit: 2017-11-04 | Discharge: 2017-11-04 | Disposition: A | Discharge status: Home / Self Care | Payer: Self-pay | Attending: Emergency Medicine | Admitting: Emergency Medicine

## 2017-11-04 ENCOUNTER — Encounter (HOSPITAL_COMMUNITY): Payer: Self-pay | Admitting: Emergency Medicine

## 2017-11-04 DIAGNOSIS — I10 Essential (primary) hypertension: Secondary | ICD-10-CM | POA: Insufficient documentation

## 2017-11-04 DIAGNOSIS — R0602 Shortness of breath: Secondary | ICD-10-CM | POA: Insufficient documentation

## 2017-11-04 DIAGNOSIS — R0789 Other chest pain: Secondary | ICD-10-CM | POA: Insufficient documentation

## 2017-11-04 DIAGNOSIS — M62830 Muscle spasm of back: Secondary | ICD-10-CM | POA: Insufficient documentation

## 2017-11-04 DIAGNOSIS — Z79899 Other long term (current) drug therapy: Secondary | ICD-10-CM | POA: Insufficient documentation

## 2017-11-04 DIAGNOSIS — F17210 Nicotine dependence, cigarettes, uncomplicated: Secondary | ICD-10-CM | POA: Insufficient documentation

## 2017-11-04 LAB — I-STAT CHEM 8, ED
BUN: 13 mg/dL (ref 6–20)
CHLORIDE: 99 mmol/L — AB (ref 101–111)
CREATININE: 0.7 mg/dL (ref 0.61–1.24)
Calcium, Ion: 1.14 mmol/L — ABNORMAL LOW (ref 1.15–1.40)
GLUCOSE: 87 mg/dL (ref 65–99)
HEMATOCRIT: 41 % (ref 39.0–52.0)
Hemoglobin: 13.9 g/dL (ref 13.0–17.0)
POTASSIUM: 4 mmol/L (ref 3.5–5.1)
Sodium: 139 mmol/L (ref 135–145)
TCO2: 27 mmol/L (ref 22–32)

## 2017-11-04 MED ORDER — CYCLOBENZAPRINE HCL 10 MG PO TABS
10.0000 mg | ORAL_TABLET | Freq: Once | ORAL | Status: AC
  Administered 2017-11-04: 10 mg via ORAL
  Filled 2017-11-04: qty 1

## 2017-11-04 MED ORDER — IOPAMIDOL (ISOVUE-370) INJECTION 76%
100.0000 mL | Freq: Once | INTRAVENOUS | Status: AC | PRN
  Administered 2017-11-04: 100 mL via INTRAVENOUS

## 2017-11-04 MED ORDER — OXYCODONE-ACETAMINOPHEN 5-325 MG PO TABS
2.0000 | ORAL_TABLET | Freq: Once | ORAL | Status: AC
  Administered 2017-11-04: 2 via ORAL
  Filled 2017-11-04: qty 2

## 2017-11-04 MED ORDER — PROMETHAZINE HCL 12.5 MG PO TABS
12.5000 mg | ORAL_TABLET | Freq: Once | ORAL | Status: AC
  Administered 2017-11-04: 12.5 mg via ORAL
  Filled 2017-11-04: qty 1

## 2017-11-04 MED ORDER — CYCLOBENZAPRINE HCL 10 MG PO TABS: 10.0000 mg | ORAL_TABLET | Freq: Three times a day (TID) | ORAL | 0 refills | Status: AC

## 2017-11-04 NOTE — ED Provider Notes (Signed)
[facility] EMERGENCY DEPARTMENT Provider Note   CSN: [id number] Arrival date & time: 11/04/17  1318     History   Chief Complaint Chief Complaint  Patient presents with  . Back Pain    HPI Rodney Bryant is a 42 y.o. male.  Patient is a 42 year old male who presents to the emergency department with a complaint of chest area pain and back pain. The patient states that approximately 2 or 3 weeks ago he threw a heavy stick at a dog who was attacking his chickens, since that time is been having pain with the upper back and on the right side.  He was seen in the emergency department on February 1.  At that particular time he was noted to have a negative cervical spine film a negative thoracic spine films and a negative shoulder film.  Patient was treated with anti-inflammatory pain medication and muscle relaxers.  The patient states that the pain is getting progressively worse.  He states that he cannot find a comfortable position to be in in bed.  He now also has a sensation that he is short of breath at times he is not coughing up any blood, he says that he has had some chills, but is unsure of temperature elevations.  Patient states that the right upper rib area, the area under her shoulder blade, and the area of his right anterior chest have both a pain and a burning type sensation.  He at times also has a sensation of burning on the left.  The patient requests reevaluation and assistance with his increasing pain.       Past Medical History:  Diagnosis Date  . Hypertension     Patient Active Problem List   Diagnosis Date Noted  . Obesity, unspecified 11/07/2015  . Essential hypertension, benign 07/27/2015  . Hyperlipidemia 07/27/2015  . Difficulty walking 03/03/2014  . Stiffness of joint, not elsewhere classified, lower leg 03/03/2014    Past Surgical History:  Procedure Laterality Date  . boil     lanced at [facility]  . KNEE ARTHROSCOPY Left 02/28/2014   Procedure:  ARTHROSCOPY KNEE;  Surgeon: Darreld Mclean, MD;  Location: [facility];  Service: Orthopedics;  Laterality: Left;  . KNEE ARTHROSCOPY WITH LATERAL MENISECTOMY Left 02/28/2014   Procedure: KNEE ARTHROSCOPY WITH LATERAL MENISECTOMY;  Surgeon: Darreld Mclean, MD;  Location: [facility];  Service: Orthopedics;  Laterality: Left;  . KNEE ARTHROSCOPY WITH MEDIAL MENISECTOMY Left 02/28/2014   Procedure: KNEE ARTHROSCOPY WITH MEDIAL MENISECTOMY;  Surgeon: Darreld Mclean, MD;  Location: [facility];  Service: Orthopedics;  Laterality: Left;       Home Medications    Prior to Admission medications   Medication Sig Start Date End Date Taking? Authorizing Provider  atenolol (TENORMIN) 100 MG tablet 1 po qd for blood pressure.  Tome una tableta por boca diaria 10/30/17   Eber Hong, MD  atorvastatin (LIPITOR) 20 MG tablet Take 1 tablet (20 mg total) by mouth daily. Tome una tableta por boca al dormir Patient not taking: Reported on 02/06/2016 11/07/15   Jacquelin Hawking, PA-C  ibuprofen (ADVIL,MOTRIN) 600 MG tablet Take 1 tablet (600 mg total) by mouth every 6 (six) hours as needed. 10/30/17   Eber Hong, MD  methocarbamol (ROBAXIN) 500 MG tablet Take 1 tablet (500 mg total) by mouth 2 (two) times daily as needed for muscle spasms. 10/30/17   Eber Hong, MD  simvastatin (ZOCOR) 20 MG tablet 1 po qhs for cholesterol.  Tome una tableta por  boca al dormir Patient not taking: Reported on 10/30/2017 12/24/15   Jacquelin HawkingMcElroy, Shannon, PA-C    Family History Family History  Problem Relation Age of Onset  . Diabetes Other     Social History Social History   Tobacco Use  . Smoking status: Current Every Day Smoker    Packs/day: 0.03    Years: 15.00    Pack years: 0.45    Types: Cigarettes  . Smokeless tobacco: Never Used  Substance Use Topics  . Alcohol use: No    Comment: quit 3 mos ago  . Drug use: No     Allergies   Patient has no known allergies.   Review of Systems Review of Systems  Constitutional: Negative for  activity change.       All ROS Neg except as noted in HPI  HENT: Negative for nosebleeds.   Eyes: Negative for photophobia and discharge.  Respiratory: Positive for shortness of breath. Negative for cough and wheezing.        Chest wall pain.  Cardiovascular: Negative for chest pain and palpitations.  Gastrointestinal: Negative for abdominal pain and blood in stool.  Genitourinary: Negative for dysuria, frequency and hematuria.  Musculoskeletal: Positive for back pain and neck pain. Negative for arthralgias.  Skin: Negative.   Neurological: Negative for dizziness, seizures and speech difficulty.  Psychiatric/Behavioral: Negative for confusion and hallucinations.     Physical Exam Updated Vital Signs BP (!) 143/105 (BP Location: Left Arm)   Pulse 67   Temp 99.1 F (37.3 C) (Oral)   Resp 20   Ht 5\' 9"  (1.753 m)   Wt 86.2 kg (190 lb)   SpO2 100%   BMI 28.06 kg/m   Physical Exam  Constitutional: He is oriented to person, place, and time. He appears well-developed and well-nourished.  Non-toxic appearance.  HENT:  Head: Normocephalic.  Right Ear: Tympanic membrane and external ear normal.  Left Ear: Tympanic membrane and external ear normal.  Eyes: EOM and lids are normal. Pupils are equal, round, and reactive to light.  Neck: Normal range of motion. Neck supple. Carotid bruit is not present.  Cardiovascular: Normal rate, regular rhythm, normal heart sounds, intact distal pulses and normal pulses.  Pulmonary/Chest: Breath sounds normal. No respiratory distress.        Abdominal: Soft. Bowel sounds are normal. There is no tenderness. There is no guarding.  Musculoskeletal: He exhibits tenderness.       Right shoulder: He exhibits decreased range of motion, tenderness and spasm.  Lymphadenopathy:       Head (right side): No submandibular adenopathy present.       Head (left side): No submandibular adenopathy present.    He has no cervical adenopathy.  Neurological: He is  alert and oriented to person, place, and time. He has normal strength. No cranial nerve deficit or sensory deficit.  Skin: Skin is warm and dry.  Psychiatric: He has a normal mood and affect. His speech is normal.  Nursing note and vitals reviewed.    ED Treatments / Results  Labs (all labs ordered are listed, but only abnormal results are displayed) Labs Reviewed  BASIC METABOLIC PANEL    EKG  EKG Interpretation None       Radiology No results found.  Procedures Procedures (including critical care time)  Medications Ordered in ED Medications  oxyCODONE-acetaminophen (PERCOCET/ROXICET) 5-325 MG per tablet 2 tablet (not administered)  promethazine (PHENERGAN) tablet 12.5 mg (not administered)  cyclobenzaprine (FLEXERIL) tablet 10 mg (not administered)  Initial Impression / Assessment and Plan / ED Course  I have reviewed the triage vital signs and the nursing notes.  Pertinent labs & imaging results that were available during my care of the patient were reviewed by me and considered in my medical decision making (see chart for details).       Final Clinical Impressions(s) / ED Diagnoses MDM Patient complains of back pain, and some chest area pain particularly over the rib areas.  He states that he has sensations of difficulty with breathing because of the pain that he is having.  He denies hemoptysis and he denies high fever, but states he felt feverish recently.  Review of previous records shows the cervical spine, thoracic spine, and shoulder x-rays all negative.  We will recheck CT scan of the cervical spine and will also do a CT angios chest to rule out the possibility of PE.  Pulse oximetry 98-100% on room air.  Patient treated in the emergency department with Percocet and Flexeril with significant improvement in the patient's pain. The CT anterior chest is negative for PE.  No pericardial effusion, and no other significant abnormality.  X-ray of the cervical  spine shows no acute finding and no visible impingement.  Patient is ambulatory in the room without significant problem.  At this time we will change the Robaxin to Flexeril.  The patient will continue the ibuprofen.  Have asked him to use a heating pad to the area, and to follow-up with Jacquelin Hawking will return to the emergency department if any changes or problems.  Patient is in agreement with this plan.   Final diagnoses:  Muscle spasm of back  Chest wall pain    ED Discharge Orders        Ordered    cyclobenzaprine (FLEXERIL) 10 MG tablet  3 times daily     11/04/17 1832       Ivery Quale, PA-C 11/04/17 2027    Raeford Razor, MD 11/05/17 [phone]

## 2017-11-04 NOTE — Discharge Instructions (Signed)
I have reviewed the x-rays from your previous visit, your cervical spine and thoracic spine and shoulder x-rays were negative.  Tonight you had a CT scan of your chest.  There is no pneumonias, blood clots, rib fractures, or other abnormalities noted.  You also had a CT scan of your cervical spine which is negative for acute fracture or dislocation or acute disc related problem.  Heating pad to your neck and shoulder and chest may be helpful.  Please stop the Robaxin.  Please use Flexeril 3 times daily.  Please rest her back and shoulder over the next day or 2.  Please do not drive a vehicle, drink alcohol, operate machinery, or participate in activities that require concentration when taking this medication.  Please continue your ibuprofen as previously ordered.  Please see Jacquelin HawkingShannon McElroy for additional evaluation and management if not improving.

## 2017-11-04 NOTE — ED Triage Notes (Signed)
Patient seen here 5 days ago for same complaint of back pain. Pt c/o pain in his neck, back, shoulder and side. Patient also c/o reflux.

## 2017-11-11 ENCOUNTER — Encounter (HOSPITAL_COMMUNITY): Payer: Self-pay | Admitting: Emergency Medicine

## 2017-11-11 ENCOUNTER — Emergency Department (HOSPITAL_COMMUNITY): Payer: Self-pay

## 2017-11-11 ENCOUNTER — Emergency Department (HOSPITAL_COMMUNITY)
Admission: EM | Admit: 2017-11-11 | Discharge: 2017-11-11 | Disposition: A | Discharge status: Home / Self Care | Payer: Self-pay | Attending: Emergency Medicine | Admitting: Emergency Medicine

## 2017-11-11 DIAGNOSIS — M62830 Muscle spasm of back: Secondary | ICD-10-CM | POA: Insufficient documentation

## 2017-11-11 DIAGNOSIS — M5441 Lumbago with sciatica, right side: Secondary | ICD-10-CM | POA: Insufficient documentation

## 2017-11-11 DIAGNOSIS — M25511 Pain in right shoulder: Secondary | ICD-10-CM | POA: Insufficient documentation

## 2017-11-11 DIAGNOSIS — I10 Essential (primary) hypertension: Secondary | ICD-10-CM | POA: Insufficient documentation

## 2017-11-11 DIAGNOSIS — G8929 Other chronic pain: Secondary | ICD-10-CM | POA: Insufficient documentation

## 2017-11-11 DIAGNOSIS — F17210 Nicotine dependence, cigarettes, uncomplicated: Secondary | ICD-10-CM | POA: Insufficient documentation

## 2017-11-11 DIAGNOSIS — M62838 Other muscle spasm: Secondary | ICD-10-CM

## 2017-11-11 MED ORDER — HYDROCODONE-ACETAMINOPHEN 5-325 MG PO TABS
1.0000 | ORAL_TABLET | Freq: Once | ORAL | Status: AC
  Administered 2017-11-11: 1 via ORAL
  Filled 2017-11-11: qty 1

## 2017-11-11 MED ORDER — PREDNISONE 20 MG PO TABS: ORAL_TABLET | ORAL | 0 refills | Status: AC

## 2017-11-11 MED ORDER — LIDOCAINE 5 % EX PTCH: 1.0000 | MEDICATED_PATCH | CUTANEOUS | 0 refills | Status: AC

## 2017-11-11 MED ORDER — MELOXICAM 15 MG PO TABS: 15.0000 mg | ORAL_TABLET | Freq: Every day | ORAL | 0 refills | Status: AC

## 2017-11-11 NOTE — Discharge Instructions (Signed)
You were seen here today for Back Pain and right shoulder pain: Back pain is discomfort in the lower back that may be due to injuries to muscles and ligaments around the spine. Occasionally, it may be caused by a problem to a part of the spine called a disc. Your back pain should be treated with medicines listed below as well as back exercises  It is important to know however, if you develop severe or worsening pain, low back pain with fever, numbness, weakness or inability to walk or urinate, you should return to the ER immediately.  Please follow up with your doctor this week for a recheck if still having symptoms.  HOME INSTRUCTIONS Self - care:  The application of heat can help soothe the pain.  Maintaining your daily activities, including walking (this is encouraged), as it will help you get better faster than just staying in bed. Do not life, push, pull anything more than 10 pounds for the next week. I am attaching back exercises that you can do at home to help facilitate your recovery.   Back Exercises - I have attached a handout on back exercises that can be done at home to help facilitate your recovery.   Medications are also useful to help with pain control.   Acetaminophen.  This medication is generally safe, and found over the counter. Take as directed for your age. You should not take more than 8 of the extra strength (500mg ) pills a day (max dose is 4000mg  total OVER one day)  Non steroidal anti inflammatory: I am switching you from ibuprofen to Mobic.   Muscle relaxants:  These medications can help with muscle tightness that is a cause of lower back pain.  Most of these medications can cause drowsiness, and it is not safe to drive or use dangerous machinery while taking them. They are primarily helpful when taken at night before sleep. - continue your flexeril   Prednisone - Prednisone can be used for low back pain when nerves such as the sciatic nerve are though to be involved. This  medication will help decrease the inflammation around the nerve and help facilitate faster recovery. This type of medicine is "tapered" which means you take a little less as time goes on. You have been given 12 pills. Please read the instructions on the container which will tell you to take three 20 mg tablets once a day for 2 days, then two tablets once a day for 2 days, and then one tablet once a day for 2 days. Call your pharmacist if you have any questions.   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  Take medications as prescribed.  Please follow attached handout.  Please take your arm out of the shoulder sling at least once per day and perform shoulder range of motion exercises.  You will need to follow up with your primary healthcare provider  or the Orthopedist in 1-2 weeks for reassessment and persistent symptoms.  Be aware that if you develop new symptoms, such as a fever, leg weakness, difficulty with or loss of control of your urine or bowels, abdominal pain, or more severe pain, you will need to seek medical attention and/or return to the Emergency department. Additional Information:  Your vital signs today were: BP 120/67 (BP Location: Left Arm)    Pulse 66    Temp 98.2 F (36.8 C) (Oral)    Resp 20    SpO2 99%  If your blood pressure (BP) was  elevated above 135/85 this visit, please have this repeated by your doctor within one month. ---------------

## 2017-11-11 NOTE — ED Triage Notes (Signed)
Patient c/o lower back pain that radiates up back and around to front that has been going on for several weeks. Reports that he was seen at another facility and told muscle spasms and given pain medications which arent helping. Patient does have dry cough

## 2017-11-11 NOTE — ED Provider Notes (Signed)
[name]-EMERGENCY DEPT Provider Note   CSN: [id number] Arrival date & time: 11/11/17  1333     History   Chief Complaint Chief Complaint  Patient presents with  . Back Pain  . Spasms  . Cough    HPI Rodney Bryant is a 42 y.o. male with a history of hypertension who presents the emergency department today for back pain.  Patient reports in early January that a" dog had gotten into his chickens" and he tried to chase the dog off by throwing a large piece of wood at it.  Immediately following the event he had acute onset of right-sided shoulder and upper back pain.  After several weeks the pain radiated down into his bilateral lower back as well.  He also noted that the pain radiated from his right shoulder into his right trapezius and right chest as well.  He was first evaluated for this on 10/30/2017. At that time the patient had negative xrays for right shoulder, cervical and thoracic spine. The patient was discharged home on ibuprofen and robaxin. He was seen again in the ED on 11/11/2017 where patient notes that his pain has gotten progressively worse.  The pain did not change in location but was now making him short of breath from the severity.  He received a CT of the cervical spine and a Chest CTA that were negative.  The patient had his medications switched to Flexeril and was discharged home. He has not followed up.  Patient is presenting today because he has continued pain.  Patient notes that the pain is no better or worse since his visit on 2/6.  He notes that he cannot get comfortable in bed and now feels severe pain whenever changing positions.  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  The patient notes that pain in his  shoulder is worsened with abduction and flexion.  He feels tension in his bilateral trapezius, worse in the right as well as pain down the right scapula.  He denies any numbness/tingling/weakness of the upper extremity.  He notes that the medications at home have not helped him.  His wife did give him a massage that gave him relief for 24 hours.  His pain is not brought on by exertion. Patient denies any current chest pain, shortness of breath, hemoptysis, nausea or cough. Denies history of cancer, trauma, fever, night pain, IV drug use, recent spinal manipulation or procedures,  numbness/tingling/weakness of the lower extremities, urinary retention, loss of bowel/bladder function, saddle anesthesia, or unexplained weight loss. Denies dysuria, flank pain, suprapubic pain, frequency, urgency, or hematuria.   HPI  Past Medical History:  Diagnosis Date  . Hypertension     Patient Active Problem List   Diagnosis Date Noted  . Obesity, unspecified 11/07/2015  . Essential hypertension, benign 07/27/2015  . Hyperlipidemia 07/27/2015  . Difficulty walking 03/03/2014  . Stiffness of joint, not elsewhere classified, lower leg 03/03/2014    Past Surgical History:  Procedure Laterality Date  . boil     lanced at [facility]  . KNEE ARTHROSCOPY Left 02/28/2014   Procedure: ARTHROSCOPY KNEE;  Surgeon: Darreld Mclean, MD;  Location: [facility];  Service: Orthopedics;  Laterality: Left;  .  KNEE ARTHROSCOPY WITH LATERAL MENISECTOMY Left 02/28/2014   Procedure: KNEE ARTHROSCOPY WITH LATERAL MENISECTOMY;  Surgeon: Darreld McleanWayne Keeling, MD;  Location: [facility];  Service: Orthopedics;  Laterality: Left;  . KNEE ARTHROSCOPY WITH MEDIAL MENISECTOMY Left 02/28/2014   Procedure: KNEE ARTHROSCOPY WITH MEDIAL MENISECTOMY;  Surgeon: Darreld McleanWayne Keeling, MD;  Location: [facility];  Service: Orthopedics;  Laterality: Left;       Home Medications    Prior to Admission medications   Medication Sig Start Date End Date Taking? Authorizing Provider    atenolol (TENORMIN) 100 MG tablet 1 po qd for blood pressure.  Tome una tableta por boca diaria 10/30/17  Yes Eber HongMiller, Brian, MD  cyclobenzaprine (FLEXERIL) 10 MG tablet Take 1 tablet (10 mg total) by mouth 3 (three) times daily. 11/04/17  Yes Ivery QualeBryant, Hobson, PA-C  ibuprofen (ADVIL,MOTRIN) 600 MG tablet Take 1 tablet (600 mg total) by mouth every 6 (six) hours as needed. 10/30/17  Yes Eber HongMiller, Brian, MD  methocarbamol (ROBAXIN) 500 MG tablet Take 1 tablet (500 mg total) by mouth 2 (two) times daily as needed for muscle spasms. 10/30/17  Yes Eber HongMiller, Brian, MD    Family History Family History  Problem Relation Age of Onset  . Diabetes Other     Social History Social History   Tobacco Use  . Smoking status: Current Every Day Smoker    Packs/day: 0.03    Years: 15.00    Pack years: 0.45    Types: Cigarettes  . Smokeless tobacco: Never Used  Substance Use Topics  . Alcohol use: No    Comment: quit 3 mos ago  . Drug use: No     Allergies   Patient has no known allergies.   Review of Systems Review of Systems  All other systems reviewed and are negative.    Physical Exam Updated Vital Signs BP 120/67 (BP Location: Left Arm)   Pulse 66   Temp 98.2 F (36.8 C) (Oral)   Resp 20   SpO2 99%   Physical Exam  Constitutional: He appears well-developed and well-nourished. No distress.  Non-toxic appearing  HENT:  Head: Normocephalic and atraumatic.  Right Ear: External ear normal.  Left Ear: External ear normal.  Neck: Normal range of motion. Neck supple. No spinous process tenderness present. No neck rigidity. Normal range of motion present.  Cardiovascular: Normal rate, regular rhythm, normal heart sounds and intact distal pulses.  No murmur heard. Pulses:      Radial pulses are 2+ on the right side, and 2+ on the left side.       Femoral pulses are 2+ on the right side, and 2+ on the left side.      Dorsalis pedis pulses are 2+ on the right side, and 2+ on the left side.        Posterior tibial pulses are 2+ on the right side, and 2+ on the left side.  Pulmonary/Chest: Effort normal and breath sounds normal. No respiratory distress.  Abdominal: Soft. Bowel sounds are normal. He exhibits no pulsatile midline mass. There is no tenderness. There is no rigidity, no rebound and no CVA tenderness.  Musculoskeletal:  Cervical Spine: Appearance normal. No obvious bony deformity. No skin swelling, erythema, heat, fluctuance or break of the skin. No TTP over the cervical spinous processes. Bilateral paraspinal tenderness that is worse on the right. No step-offs. Patient is able to actively rotate their neck 45 degrees left and right voluntarily without pain and flex and extend the neck without pain. Negative  Spurling's and Cervical Load test.  Right Shoulder: Appearance normal. No obvious bony deformity. No skin swelling, erythema, heat, fluctuance or break of the skin. No clavicular deformity or TTP. TTP over bicipital groove, acromioclavicular joint and superior scapular spine.  Active flexion and abduction limited to 90 degrees due to pain.  Patient is able to flex and abduct to approximately 150 degrees passively with pain. Active and passive extension, adduction, and internal/external rotation intact without pain or crepitus. Strength for flexion, extension, abduction, adduction, and internal/external rotation intact and appropriate for age. Positive Hawkin's and Neer's test.  Equivocal speeds test. Negative drop arm test.   Right Elbow: Appearance normal. No obvious bony deformity. No skin swelling, erythema, heat, fluctuance or break of the skin. No TTP over joint. Active flexion, extension, supination and pronation full and intact without pain. Strength able and appropriate for age for flexion and extension.  Radial Pulse 2+. Cap refill <2 seconds. SILT for M/U/R distributions. Upper extremity compartments soft.  Back: Patient is holding himself tilted to the right and states this  is due to the pain in his back and shoulder. No evidence of kyphosis. No obvious signs of skin changes, trauma, deformity, infection. No C, T, or L spine tenderness or step-offs to palpation. There is diffuse C, T, or L paraspinal tenderness, all worse on the right. Lung expansion normal. Bilateral lower extremity strength 5 out of 5. Patellar and Achilles deep tendon reflex 2+ and equal bilaterally. Sensation of lower extremities grossly intact. Straight leg right positve. Straight leg left negative. Gait able but patient notes painful. Lower extremity compartments soft. PT and DP 2+ b/l. Cap refill <2 seconds.   Neurological: He is alert.  Skin: Skin is warm, dry and intact. Capillary refill takes less than 2 seconds. No rash noted. He is not diaphoretic. No erythema.  Nursing note and vitals reviewed.    ED Treatments / Results  Labs (all labs ordered are listed, but only abnormal results are displayed) Labs Reviewed - No data to display  EKG  EKG Interpretation None       Radiology Dg Chest 2 View  Result Date: 11/11/2017 CLINICAL DATA:  Chest pain and shortness of breath for the past 3 weeks with increased severity this morning. Patient also reports flank pain. EXAM: CHEST  2 VIEW COMPARISON:  CT scan of the chest of November 04, 2017. FINDINGS: The lungs are adequately inflated. There is no focal infiltrate. There is no pleural effusion. The heart and pulmonary vascularity are normal. The trachea is midline. The bony thorax exhibits no acute abnormality. IMPRESSION: There is no pneumonia nor other active cardiopulmonary disease. Electronically Signed   By: David  [place] M.D.   On: 11/11/2017 14:43    Procedures Procedures (including critical care time)  Medications Ordered in ED Medications  HYDROcodone-acetaminophen (NORCO/VICODIN) 5-325 MG per tablet 1 tablet (1 tablet Oral Given 11/11/17 1522)     Initial Impression / Assessment and Plan / ED Course  I have reviewed the  triage vital signs and the nursing notes.  Pertinent labs & imaging results that were available during my care of the patient were reviewed by me and considered in my medical decision making (see chart for details).      42 y.o. male with a history of hypertension who presents the emergency department today for back pain.  Patient reports in early January that a" dog had gotten into his chickens" and he tried to chase the dog off by throwing a  large piece of wood at it.  Immediately following the event he had acute onset of right-sided shoulder and upper back pain.  After several weeks the pain radiated down into his bilateral lower back as well.  He also noted that the pain radiated from his right shoulder into his right trapezius and right chest as well.  He was first evaluated for this on 10/30/2017. At that time the patient had negative xrays for right shoulder, cervical and thoracic spine. The patient was discharged home on ibuprofen and robaxin. He was seen again in the ED on 11/11/2017 where patient notes that his pain has gotten progressively worse.  The pain did not change in location but was now making him short of breath from the severity.  He received a CT of the cervical spine and a Chest CTA that were negative.  The patient had his medications switched to Flexeril and was discharged home. He has not followed up.  He is presenting with continued pain.  Patient reports that his pain is positional.  He reports right shoulder pain that is worse with movement.  No paresthesias associated with it.  He also reports right lower back pain with pain and tingling shooting down his legs. He is also with pain in his chest on palpation. Negative chest xray today and negative CTA and CXR in the past. Does report SOB 2/2 to pain. Pain is non-exertional. No assoicated nausea or diaphoresis. Do not suspect this is related to ACS. Patient has received extensive workup for this in the past visits and will continue to  treat conservatively with close follow up with PCP.   Patient is exam without C-spine tenderness palpation.  No evidence of cervical radiculopathy.  Exam is not consistent with septic joint as the patient is without fever, joint swelling, or overlying erythema.  He does have mildly decreased range of motion with limitations of flexion and abduction to 150 degrees passively.  Suspect the patient has rotator cuff pathology/cervical impingement syndrome given exam.  No evidence of frozen shoulder as the patient is able to externally rotate his arm without difficulty.  No obvious deformities.  No new trauma. Previous xray negative for fracture or dislocation. Will give the patient a shoulder sling, shoulder exercises and referral to orthopedics.  Conservative therapy recommended.  Patient with atraumatic back pain.  No neurologic deficits and normal neuro exam.  Patient can walk but states it is painful.  No bowel or bladder incontinence.  No urinary retention or saddle anesthesia.  No concern for cauda equina.  No history of trauma, personal history of cancer, night sweats or weight loss that would warrant a x-ray at this time.  No spinal injections, fever or IV drug use that make me concerned for spinal hematoma or abscess.  No pulsatile mass of the abdomen.  No abdominal tenderness to palpation or guarding to make me concern for intra-abdominal pathology.  No urinary symptoms or CVA tenderness to make me concerned for cystitis versus pyelonephritis versus kidney stone.  Suspect patient's symptoms are musculoskeletal in nature. There is evidence of sciatica with right sided straight leg raise and history of pain and tingling running down the patient's leg.  Will treat the patient with back exercises, activity modification, lidocaine patch, recommendation to continue muscle relaxers, & switch patient's NSAID (no history of kidney disease or GI bleed) from ibuprofen to Mobic. Reports difficulty taking on time and  believe this may help him with his symptoms as it is only once daily.  Will also give steroids for sciatica.  Patient is to follow-up with PCP versus orthopedics. 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 Strict return precautions discussed.  Patient appears safe for discharge.  Final Clinical Impressions(s) / ED Diagnoses   Final diagnoses:  Chronic bilateral low back pain with right-sided sciatica  Muscle spasm  Acute pain of right shoulder    ED Discharge Orders        Ordered    predniSONE (DELTASONE) 20 MG tablet     11/11/17 1611    meloxicam (MOBIC) 15 MG tablet  Daily     11/11/17 1611    lidocaine (LIDODERM) 5 %  Every 24 hours     11/11/17 1611       Princella Pellegrini 11/11/17 2200    Donnetta Hutching, MD 11/12/17 [phone]

## 2018-01-14 ENCOUNTER — Emergency Department (HOSPITAL_COMMUNITY): Payer: Self-pay

## 2018-01-14 ENCOUNTER — Emergency Department (HOSPITAL_COMMUNITY)
Admission: EM | Admit: 2018-01-14 | Discharge: 2018-01-14 | Disposition: A | Discharge status: Home / Self Care | Payer: Self-pay | Attending: Emergency Medicine | Admitting: Emergency Medicine

## 2018-01-14 ENCOUNTER — Encounter (HOSPITAL_COMMUNITY): Payer: Self-pay | Admitting: *Deleted

## 2018-01-14 ENCOUNTER — Other Ambulatory Visit: Payer: Self-pay

## 2018-01-14 DIAGNOSIS — Y939 Activity, unspecified: Secondary | ICD-10-CM | POA: Insufficient documentation

## 2018-01-14 DIAGNOSIS — X500XXA Overexertion from strenuous movement or load, initial encounter: Secondary | ICD-10-CM | POA: Insufficient documentation

## 2018-01-14 DIAGNOSIS — Y9289 Other specified places as the place of occurrence of the external cause: Secondary | ICD-10-CM | POA: Insufficient documentation

## 2018-01-14 DIAGNOSIS — M25512 Pain in left shoulder: Secondary | ICD-10-CM

## 2018-01-14 DIAGNOSIS — G8929 Other chronic pain: Secondary | ICD-10-CM | POA: Insufficient documentation

## 2018-01-14 DIAGNOSIS — I10 Essential (primary) hypertension: Secondary | ICD-10-CM | POA: Insufficient documentation

## 2018-01-14 DIAGNOSIS — F17210 Nicotine dependence, cigarettes, uncomplicated: Secondary | ICD-10-CM | POA: Insufficient documentation

## 2018-01-14 DIAGNOSIS — Z79899 Other long term (current) drug therapy: Secondary | ICD-10-CM | POA: Insufficient documentation

## 2018-01-14 DIAGNOSIS — S46812A Strain of other muscles, fascia and tendons at shoulder and upper arm level, left arm, initial encounter: Secondary | ICD-10-CM | POA: Insufficient documentation

## 2018-01-14 DIAGNOSIS — Y990 Civilian activity done for income or pay: Secondary | ICD-10-CM | POA: Insufficient documentation

## 2018-01-14 LAB — COMPREHENSIVE METABOLIC PANEL
ALT: 33 U/L (ref 17–63)
ANION GAP: 13 (ref 5–15)
AST: 17 U/L (ref 15–41)
Albumin: 3.1 g/dL — ABNORMAL LOW (ref 3.5–5.0)
Alkaline Phosphatase: 308 U/L — ABNORMAL HIGH (ref 38–126)
BILIRUBIN TOTAL: 0.7 mg/dL (ref 0.3–1.2)
BUN: 9 mg/dL (ref 6–20)
CO2: 28 mmol/L (ref 22–32)
Calcium: 9.5 mg/dL (ref 8.9–10.3)
Chloride: 98 mmol/L — ABNORMAL LOW (ref 101–111)
Creatinine, Ser: 0.79 mg/dL (ref 0.61–1.24)
GFR calc Af Amer: >60 mL/min (ref >60)
Glucose, Bld: 101 mg/dL — ABNORMAL HIGH (ref 65–99)
POTASSIUM: 4.1 mmol/L (ref 3.5–5.1)
Sodium: 139 mmol/L (ref 135–145)
TOTAL PROTEIN: 8.5 g/dL — AB (ref 6.5–8.1)

## 2018-01-14 LAB — CBC WITH DIFFERENTIAL/PLATELET
BASOS PCT: 0 %
Basophils Absolute: 0 10*3/uL (ref 0.0–0.1)
Eosinophils Absolute: 0.5 10*3/uL (ref 0.0–0.7)
Eosinophils Relative: 3 %
HEMATOCRIT: 36.5 % — AB (ref 39.0–52.0)
Hemoglobin: 11.6 g/dL — ABNORMAL LOW (ref 13.0–17.0)
LYMPHS PCT: 25 %
Lymphs Abs: 3.6 10*3/uL (ref 0.7–4.0)
MCH: 26.5 pg (ref 26.0–34.0)
MCHC: 31.8 g/dL (ref 30.0–36.0)
MCV: 83.5 fL (ref 78.0–100.0)
MONO ABS: 1.1 10*3/uL — AB (ref 0.1–1.0)
MONOS PCT: 8 %
NEUTROS ABS: 9.2 10*3/uL — AB (ref 1.7–7.7)
Neutrophils Relative %: 64 %
Platelets: 589 10*3/uL — ABNORMAL HIGH (ref 150–400)
RBC: 4.37 MIL/uL (ref 4.22–5.81)
RDW: 13.6 % (ref 11.5–15.5)
WBC: 14.4 10*3/uL — ABNORMAL HIGH (ref 4.0–10.5)

## 2018-01-14 MED ORDER — NAPROXEN 500 MG PO TABS: 500.0000 mg | ORAL_TABLET | Freq: Two times a day (BID) | ORAL | 0 refills | Status: AC

## 2018-01-14 MED ORDER — TRAMADOL HCL 50 MG PO TABS: 50.0000 mg | ORAL_TABLET | Freq: Four times a day (QID) | ORAL | 0 refills | Status: AC | PRN

## 2018-01-14 NOTE — ED Triage Notes (Signed)
Pt c/o neck, back and left leg pain x one month; pt has been seen here for same complaint with no relief; pt states he has chest pain when he takes a deep breath

## 2018-01-14 NOTE — ED Provider Notes (Signed)
[facility] EMERGENCY DEPARTMENT Provider Note   CSN: [id number] Arrival date & time: 01/14/18  1013     History   Chief Complaint Chief Complaint  Patient presents with  . Cough    HPI Rodney Bryant is a 42 y.o. male.  Patient complains of left shoulder and left upper back pain with movement.  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 No language interpreter was used.  Shoulder Pain   This is a new problem. The current episode started more than 2 days ago. The problem occurs constantly. The problem has not changed since onset.Pain location: Left shoulder. The quality of the pain is described as aching. The pain is at a severity of 7/10. The pain is severe. Associated symptoms include limited range of motion. The symptoms are aggravated by activity. He has tried nothing for the symptoms. There has been a history of trauma.    Past Medical History:  Diagnosis Date  . Hypertension     Patient Active Problem List   Diagnosis Date Noted  . Obesity, unspecified 11/07/2015  . Essential hypertension, benign 07/27/2015  . Hyperlipidemia 07/27/2015  . Difficulty walking 03/03/2014  . Stiffness of joint, not elsewhere classified, lower leg 03/03/2014    Past Surgical History:  Procedure Laterality Date  . boil     lanced at [facility]  . KNEE ARTHROSCOPY Left 02/28/2014   Procedure: ARTHROSCOPY KNEE;  Surgeon: Darreld Mclean, MD;  Location: [facility];  Service: Orthopedics;  Laterality: Left;  . KNEE ARTHROSCOPY WITH LATERAL MENISECTOMY Left 02/28/2014   Procedure: KNEE ARTHROSCOPY WITH LATERAL MENISECTOMY;  Surgeon: Darreld Mclean, MD;  Location: [facility];  Service: Orthopedics;  Laterality: Left;  . KNEE ARTHROSCOPY WITH MEDIAL MENISECTOMY Left 02/28/2014   Procedure: KNEE ARTHROSCOPY WITH MEDIAL MENISECTOMY;  Surgeon: Darreld Mclean, MD;  Location: [facility];  Service: Orthopedics;  Laterality: Left;        Home  Medications    Prior to Admission medications   Medication Sig Start Date End Date Taking? Authorizing Provider  atenolol (TENORMIN) 100 MG tablet 1 po qd for blood pressure.  Tome una tableta por boca diaria 10/30/17  Yes Eber Hong, MD  ibuprofen (ADVIL,MOTRIN) 600 MG tablet Take 1 tablet (600 mg total) by mouth every 6 (six) hours as needed. 10/30/17  Yes Eber Hong, MD  lidocaine (LIDODERM) 5 % Place 1 patch onto the skin daily. Remove & Discard patch within 12 hours or as directed by MD 11/11/17  Yes Maczis, Elmer Sow, PA-C  cyclobenzaprine (FLEXERIL) 10 MG tablet Take 1 tablet (10 mg total) by mouth 3 (three) times daily. 11/04/17   Ivery Quale, PA-C  meloxicam (MOBIC) 15 MG tablet Take 1 tablet (15 mg total) by mouth daily. 11/11/17   Maczis, Elmer Sow, PA-C  methocarbamol (ROBAXIN) 500 MG tablet Take 1 tablet (500 mg total) by mouth 2 (two) times daily as needed for muscle spasms. 10/30/17   Eber Hong, MD  naproxen (NAPROSYN) 500 MG tablet Take 1 tablet (500 mg total) by mouth 2 (two) times daily. 01/14/18   Bethann Berkshire, MD  predniSONE (DELTASONE) 20 MG tablet Take three 20 mg tablets once a day for 2 days, then two tablets once a day for 2 days, and then one tablet once a day for 2 days 11/11/17   Maczis, Elmer Sow, PA-C  traMADol (ULTRAM) 50 MG tablet Take 1 tablet (50 mg total) by mouth every 6 (six) hours  as needed. 01/14/18   Bethann BerkshireZammit, Benancio Osmundson, MD    Family History Family History  Problem Relation Age of Onset  . Diabetes Other     Social History Social History   Tobacco Use  . Smoking status: Current Every Day Smoker    Packs/day: 0.03    Years: 15.00    Pack years: 0.45    Types: Cigarettes  . Smokeless tobacco: Never Used  Substance Use Topics  . Alcohol use: No    Comment: quit 3 mos ago  . Drug use: No     Allergies   Patient has no known allergies.   Review of Systems Review of Systems  Constitutional: Negative for appetite change and fatigue.  HENT:  Negative for congestion, ear discharge and sinus pressure.   Eyes: Negative for discharge.  Respiratory: Negative for cough.   Cardiovascular: Negative for chest pain.  Gastrointestinal: Negative for abdominal pain and diarrhea.  Genitourinary: Negative for frequency and hematuria.  Musculoskeletal: Negative for back pain.       Left shoulder pain  Skin: Negative for rash.  Neurological: Negative for seizures and headaches.  Psychiatric/Behavioral: Negative for hallucinations.     Physical Exam Updated Vital Signs BP 131/80   Pulse (!) 58   Temp 98.3 F (36.8 C) (Oral)   Resp 18   Ht 5\' 9"  (1.753 m)   Wt 81.6 kg (180 lb)   SpO2 98%   BMI 26.58 kg/m   Physical Exam  Constitutional: He is oriented to person, place, and time. He appears well-developed.  HENT:  Head: Normocephalic.  Eyes: Conjunctivae and EOM are normal. No scleral icterus.  Neck: Neck supple. No thyromegaly present.  Cardiovascular: Normal rate and regular rhythm. Exam reveals no gallop and no friction rub.  No murmur heard. Pulmonary/Chest: No stridor. He has no wheezes. He has no rales. He exhibits no tenderness.  Abdominal: He exhibits no distension. There is no tenderness. There is no rebound.  Musculoskeletal: He exhibits no edema.  Patient with tenderness left shoulder and left trapezius muscle  Lymphadenopathy:    He has no cervical adenopathy.  Neurological: He is oriented to person, place, and time. He exhibits normal muscle tone. Coordination normal.  Skin: No rash noted. No erythema.  Psychiatric: He has a normal mood and affect. His behavior is normal.     ED Treatments / Results  Labs (all labs ordered are listed, but only abnormal results are displayed) Labs Reviewed  CBC WITH DIFFERENTIAL/PLATELET - Abnormal; Notable for the following components:      Result Value   WBC 14.4 (*)    Hemoglobin 11.6 (*)    HCT 36.5 (*)    Platelets 589 (*)    Neutro Abs 9.2 (*)    Monocytes Absolute  1.1 (*)    All other components within normal limits  COMPREHENSIVE METABOLIC PANEL - Abnormal; Notable for the following components:   Chloride 98 (*)    Glucose, Bld 101 (*)    Total Protein 8.5 (*)    Albumin 3.1 (*)    Alkaline Phosphatase 308 (*)    All other components within normal limits    EKG None  Radiology Dg Chest 2 View  Result Date: 01/14/2018 CLINICAL DATA:  Shortness of breath with severe upper back pain during coughing and deep inspiration. EXAM: CHEST - 2 VIEW COMPARISON:  Chest x-ray dated November 11, 2017. FINDINGS: The heart size and mediastinal contours are within normal limits. Both lungs are clear. The visualized  skeletal structures are unremarkable. IMPRESSION: No active cardiopulmonary disease. Electronically Signed   By: Obie Dredge M.D.   On: 01/14/2018 11:40    Procedures Procedures (including critical care time)  Medications Ordered in ED Medications - No data to display   Initial Impression / Assessment and Plan / ED Course  I have reviewed the triage vital signs and the nursing notes.  Pertinent labs & imaging results that were available during my care of the patient were reviewed by me and considered in my medical decision making (see chart for details).     Patient with musculoskeletal strain to left shoulder and left trapezius muscle.  He was placed on Naprosyn and Ultram and will follow up with his PCP  Final Clinical Impressions(s) / ED Diagnoses   Final diagnoses:  Chronic left shoulder pain    ED Discharge Orders        Ordered    naproxen (NAPROSYN) 500 MG tablet  2 times daily     01/14/18 1349    traMADol (ULTRAM) 50 MG tablet  Every 6 hours PRN     01/14/18 1349       Bethann Berkshire, MD 01/14/18 1352

## 2018-01-14 NOTE — Discharge Instructions (Addendum)
Follow up with a family md
